# Patient Record
Sex: Female | Born: 1970 | Race: White | Hispanic: No | Marital: Married | Smoking: Never smoker
Health system: Southern US, Community
[De-identification: ages and names within clinical notes are randomized; demographics above are authoritative.]

## PROBLEM LIST (undated history)

## (undated) DIAGNOSIS — F410 Panic disorder [episodic paroxysmal anxiety] without agoraphobia: Secondary | ICD-10-CM

## (undated) DIAGNOSIS — E669 Obesity, unspecified: Secondary | ICD-10-CM

## (undated) DIAGNOSIS — R000 Tachycardia, unspecified: Secondary | ICD-10-CM

## (undated) DIAGNOSIS — F419 Anxiety disorder, unspecified: Secondary | ICD-10-CM

## (undated) DIAGNOSIS — G500 Trigeminal neuralgia: Secondary | ICD-10-CM

## (undated) DIAGNOSIS — Z87442 Personal history of urinary calculi: Secondary | ICD-10-CM

## (undated) DIAGNOSIS — K219 Gastro-esophageal reflux disease without esophagitis: Secondary | ICD-10-CM

## (undated) DIAGNOSIS — J189 Pneumonia, unspecified organism: Secondary | ICD-10-CM

## (undated) DIAGNOSIS — M1990 Unspecified osteoarthritis, unspecified site: Secondary | ICD-10-CM

## (undated) HISTORY — PX: BACK SURGERY: SHX140

## (undated) HISTORY — PX: ABDOMINAL HYSTERECTOMY: SHX81

## (undated) HISTORY — PX: KIDNEY STONE SURGERY: SHX686

## (undated) HISTORY — PX: KNEE ARTHROSCOPY: SUR90

## (undated) HISTORY — PX: EXPLORATORY LAPAROTOMY: SUR591

## (undated) HISTORY — PX: HERNIA REPAIR: SHX51

## (undated) HISTORY — PX: CHOLECYSTECTOMY: SHX55

---

## 2013-09-09 ENCOUNTER — Emergency Department (HOSPITAL_BASED_OUTPATIENT_CLINIC_OR_DEPARTMENT_OTHER)
Admission: EM | Admit: 2013-09-09 | Discharge: 2013-09-09 | Disposition: A | Discharge status: Home / Self Care | Payer: PRIVATE HEALTH INSURANCE | Attending: Emergency Medicine | Admitting: Emergency Medicine

## 2013-09-09 ENCOUNTER — Encounter (HOSPITAL_BASED_OUTPATIENT_CLINIC_OR_DEPARTMENT_OTHER): Payer: Self-pay | Admitting: Emergency Medicine

## 2013-09-09 ENCOUNTER — Emergency Department (HOSPITAL_BASED_OUTPATIENT_CLINIC_OR_DEPARTMENT_OTHER): Payer: PRIVATE HEALTH INSURANCE

## 2013-09-09 DIAGNOSIS — S93409A Sprain of unspecified ligament of unspecified ankle, initial encounter: Secondary | ICD-10-CM | POA: Insufficient documentation

## 2013-09-09 DIAGNOSIS — X500XXA Overexertion from strenuous movement or load, initial encounter: Secondary | ICD-10-CM | POA: Insufficient documentation

## 2013-09-09 DIAGNOSIS — Y929 Unspecified place or not applicable: Secondary | ICD-10-CM | POA: Insufficient documentation

## 2013-09-09 DIAGNOSIS — Y939 Activity, unspecified: Secondary | ICD-10-CM | POA: Insufficient documentation

## 2013-09-09 DIAGNOSIS — S93402A Sprain of unspecified ligament of left ankle, initial encounter: Secondary | ICD-10-CM

## 2013-09-09 HISTORY — DX: Trigeminal neuralgia: G50.0

## 2013-09-09 HISTORY — DX: Tachycardia, unspecified: R00.0

## 2013-09-09 NOTE — ED Notes (Signed)
Pt got toe caught in planter an fell twisting her ankle yest evening

## 2013-09-09 NOTE — ED Notes (Signed)
Pt ambulated to room. Refused w/c

## 2013-09-09 NOTE — ED Provider Notes (Signed)
CSN: [id number]     Arrival date & time 09/09/13  4098 History   First MD Initiated Contact with Patient 09/09/13 [phone]     Chief Complaint  Patient presents with  . Ankle Injury   (Consider location/radiation/quality/duration/timing/severity/associated sxs/prior Treatment) Patient is a 42 y.o. female presenting with lower extremity injury. The history is provided by the patient.  Ankle Injury   patient injured her left ankle and left fourth toe yesterday when she tripped over an object. Complains of sharp pain mostly at the medial distal tibia. Also notes sharp pain at the left fourth toe. Symptoms are worse with walking and no treatment used prior to arrival. Concerned she may have a fracture or sprain which is why she presents. No other complaints of  No past medical history on file. No past surgical history on file. No family history on file. History  Substance Use Topics  . Smoking status: Not on file  . Smokeless tobacco: Not on file  . Alcohol Use: Not on file   OB History   No data available     Review of Systems  All other systems reviewed and are negative.    Allergies  Review of patient's allergies indicates not on file.  Home Medications  No current outpatient prescriptions on file. BP 155/91  Pulse 77  Temp(Src) 98.1 F (36.7 C) (Oral)  Resp 18  SpO2 100% Physical Exam  Nursing note and vitals reviewed. Constitutional: She is oriented to person, place, and time. She appears well-developed and well-nourished.  Non-toxic appearance. No distress.  HENT:  Head: Normocephalic and atraumatic.  Eyes: Conjunctivae, EOM and lids are normal. Pupils are equal, round, and reactive to light.  Neck: Normal range of motion. Neck supple. No tracheal deviation present. No mass present.  Cardiovascular: Normal rate, regular rhythm and normal heart sounds.  Exam reveals no gallop.   No murmur heard. Pulmonary/Chest: Effort normal and breath sounds normal. No stridor. No  respiratory distress. She has no decreased breath sounds. She has no wheezes. She has no rhonchi. She has no rales.  Abdominal: Soft. Normal appearance and bowel sounds are normal. She exhibits no distension. There is no tenderness. There is no rebound and no CVA tenderness.  Musculoskeletal: Normal range of motion. She exhibits no edema and no tenderness.       Feet:  Neurological: She is alert and oriented to person, place, and time. She has normal strength. No cranial nerve deficit or sensory deficit. GCS eye subscore is 4. GCS verbal subscore is 5. GCS motor subscore is 6.  Skin: Skin is warm and dry. No abrasion and no rash noted.  Psychiatric: She has a normal mood and affect. Her speech is normal and behavior is normal.    ED Course  Procedures (including critical care time) Labs Review Labs Reviewed - No data to display Imaging Review No results found.  MDM  No diagnosis found. X-rays neg, will place in ASO splint, and pt instructed to f/u her orthopedist    Toy Baker, MD 09/09/13 [phone]

## 2013-09-09 NOTE — ED Notes (Signed)
Proper crutch education provided, patient shown return demonstration.

## 2014-12-06 ENCOUNTER — Emergency Department (HOSPITAL_BASED_OUTPATIENT_CLINIC_OR_DEPARTMENT_OTHER)
Admission: EM | Admit: 2014-12-06 | Discharge: 2014-12-06 | Disposition: A | Discharge status: Home / Self Care | Payer: PRIVATE HEALTH INSURANCE | Attending: Emergency Medicine | Admitting: Emergency Medicine

## 2014-12-06 ENCOUNTER — Emergency Department (HOSPITAL_BASED_OUTPATIENT_CLINIC_OR_DEPARTMENT_OTHER): Payer: PRIVATE HEALTH INSURANCE

## 2014-12-06 ENCOUNTER — Encounter (HOSPITAL_BASED_OUTPATIENT_CLINIC_OR_DEPARTMENT_OTHER): Payer: Self-pay | Admitting: *Deleted

## 2014-12-06 DIAGNOSIS — Z79899 Other long term (current) drug therapy: Secondary | ICD-10-CM | POA: Diagnosis not present

## 2014-12-06 DIAGNOSIS — Z3202 Encounter for pregnancy test, result negative: Secondary | ICD-10-CM | POA: Diagnosis not present

## 2014-12-06 DIAGNOSIS — R1031 Right lower quadrant pain: Secondary | ICD-10-CM | POA: Insufficient documentation

## 2014-12-06 DIAGNOSIS — R109 Unspecified abdominal pain: Secondary | ICD-10-CM

## 2014-12-06 DIAGNOSIS — Z9049 Acquired absence of other specified parts of digestive tract: Secondary | ICD-10-CM | POA: Diagnosis not present

## 2014-12-06 DIAGNOSIS — Z975 Presence of (intrauterine) contraceptive device: Secondary | ICD-10-CM | POA: Diagnosis not present

## 2014-12-06 DIAGNOSIS — R1011 Right upper quadrant pain: Secondary | ICD-10-CM | POA: Diagnosis not present

## 2014-12-06 LAB — CBC WITH DIFFERENTIAL/PLATELET
BASOS ABS: 0 10*3/uL (ref 0.0–0.1)
Basophils Relative: 1 % (ref 0–1)
Eosinophils Absolute: 0 10*3/uL (ref 0.0–0.7)
Eosinophils Relative: 1 % (ref 0–5)
HEMATOCRIT: 46.6 % — AB (ref 36.0–46.0)
Hemoglobin: 15.3 g/dL — ABNORMAL HIGH (ref 12.0–15.0)
Lymphocytes Relative: 37 % (ref 12–46)
Lymphs Abs: 1.5 10*3/uL (ref 0.7–4.0)
MCH: 31.9 pg (ref 26.0–34.0)
MCHC: 32.8 g/dL (ref 30.0–36.0)
MCV: 97.3 fL (ref 78.0–100.0)
Monocytes Absolute: 0.1 10*3/uL (ref 0.1–1.0)
Monocytes Relative: 3 % (ref 3–12)
NEUTROS ABS: 2.4 10*3/uL (ref 1.7–7.7)
Neutrophils Relative %: 58 % (ref 43–77)
PLATELETS: 248 10*3/uL (ref 150–400)
RBC: 4.79 MIL/uL (ref 3.87–5.11)
RDW: 12.2 % (ref 11.5–15.5)
WBC: 4.1 10*3/uL (ref 4.0–10.5)

## 2014-12-06 LAB — PREGNANCY, URINE: Preg Test, Ur: NEGATIVE

## 2014-12-06 LAB — URINALYSIS, ROUTINE W REFLEX MICROSCOPIC
BILIRUBIN URINE: NEGATIVE
Glucose, UA: NEGATIVE mg/dL
KETONES UR: NEGATIVE mg/dL
NITRITE: NEGATIVE
PH: 5.5 (ref 5.0–8.0)
Protein, ur: NEGATIVE mg/dL
Specific Gravity, Urine: 1.015 (ref 1.005–1.030)
Urobilinogen, UA: 0.2 mg/dL (ref 0.0–1.0)

## 2014-12-06 LAB — COMPREHENSIVE METABOLIC PANEL
ALBUMIN: 4.7 g/dL (ref 3.5–5.2)
ALT: 15 U/L (ref 0–35)
AST: 21 U/L (ref 0–37)
Alkaline Phosphatase: 67 U/L (ref 39–117)
Anion gap: 9 (ref 5–15)
BILIRUBIN TOTAL: 0.7 mg/dL (ref 0.3–1.2)
BUN: 9 mg/dL (ref 6–23)
CHLORIDE: 106 meq/L (ref 96–112)
CO2: 22 mmol/L (ref 19–32)
Calcium: 9.5 mg/dL (ref 8.4–10.5)
Creatinine, Ser: 0.79 mg/dL (ref 0.50–1.10)
GFR calc Af Amer: >90 mL/min (ref >90)
Glucose, Bld: 103 mg/dL — ABNORMAL HIGH (ref 70–99)
Potassium: 3.6 mmol/L (ref 3.5–5.1)
SODIUM: 137 mmol/L (ref 135–145)
Total Protein: 7.6 g/dL (ref 6.0–8.3)

## 2014-12-06 LAB — WET PREP, GENITAL: TRICH WET PREP: NONE SEEN

## 2014-12-06 LAB — URINE MICROSCOPIC-ADD ON

## 2014-12-06 LAB — LIPASE, BLOOD: Lipase: 37 U/L (ref 11–59)

## 2014-12-06 MED ORDER — ONDANSETRON HCL 4 MG/2ML IJ SOLN
4.0000 mg | Freq: Once | INTRAMUSCULAR | Status: AC
  Administered 2014-12-06: 4 mg via INTRAVENOUS
  Filled 2014-12-06: qty 2

## 2014-12-06 MED ORDER — ONDANSETRON HCL 4 MG/2ML IJ SOLN
4.0000 mg | Freq: Once | INTRAMUSCULAR | Status: AC
Start: 2014-12-06 — End: 2014-12-06
  Administered 2014-12-06: 4 mg via INTRAVENOUS
  Filled 2014-12-06: qty 2

## 2014-12-06 MED ORDER — HYDROCODONE-ACETAMINOPHEN 5-325 MG PO TABS: 1.0000 | ORAL_TABLET | Freq: Four times a day (QID) | ORAL | Status: DC | PRN

## 2014-12-06 MED ORDER — MORPHINE SULFATE 4 MG/ML IJ SOLN
4.0000 mg | Freq: Once | INTRAMUSCULAR | Status: AC
  Administered 2014-12-06: 4 mg via INTRAVENOUS
  Filled 2014-12-06: qty 1

## 2014-12-06 MED ORDER — IOHEXOL 300 MG/ML  SOLN: 25.0000 mL | Freq: Once | INTRAMUSCULAR | Status: DC | PRN

## 2014-12-06 MED ORDER — IOHEXOL 300 MG/ML  SOLN
100.0000 mL | Freq: Once | INTRAMUSCULAR | Status: AC | PRN
  Administered 2014-12-06: 100 mL via INTRAVENOUS

## 2014-12-06 MED ORDER — MORPHINE SULFATE 4 MG/ML IJ SOLN: 4.0000 mg | Freq: Once | INTRAMUSCULAR | Status: DC

## 2014-12-06 MED ORDER — HYDROMORPHONE HCL 1 MG/ML IJ SOLN
1.0000 mg | Freq: Once | INTRAMUSCULAR | Status: AC
  Administered 2014-12-06: 1 mg via INTRAVENOUS
  Filled 2014-12-06: qty 1

## 2014-12-06 NOTE — ED Provider Notes (Signed)
CSN: [id number]     Arrival date & time 12/06/14  1301 History   First MD Initiated Contact with Patient 12/06/14 1413     Chief Complaint  Patient presents with  . Flank Pain     Patient is a 43 y.o. female presenting with abdominal pain. The history is provided by the patient.  Abdominal Pain Pain location:  RUQ and RLQ Pain quality: sharp   Pain severity:  Moderate Onset quality:  Gradual Duration:  12 hours Timing:  Constant Progression:  Worsening Chronicity:  New Relieved by:  Nothing Worsened by:  Palpation Associated symptoms: nausea   Associated symptoms: no chest pain, no dysuria, no fever, no vaginal bleeding, no vaginal discharge and no vomiting   Patient presents for sharp abd pain for past 12 hours She has never had this before She had otherwise been well prior to the start of the pain   She is sexually active with husband only She has IUD in place   Past Medical History  Diagnosis Date  . Tachycardia   . Trigeminal neuralgia    Past Surgical History  Procedure Laterality Date  . Hernia repair    . Cholecystectomy    . Kidney stone surgery    . Exploratory laparotomy     History reviewed. No pertinent family history. History  Substance Use Topics  . Smoking status: Never Smoker   . Smokeless tobacco: Not on file  . Alcohol Use: Yes   OB History    No data available     Review of Systems  Constitutional: Negative for fever.  Cardiovascular: Negative for chest pain.  Gastrointestinal: Positive for nausea and abdominal pain. Negative for vomiting.  Genitourinary: Negative for dysuria, vaginal bleeding, vaginal discharge and dyspareunia.  All other systems reviewed and are negative.     Allergies  Codeine  Home Medications   Prior to Admission medications   Medication Sig Start Date End Date Taking? Authorizing Provider  metoprolol tartrate (LOPRESSOR) 25 MG tablet Take 25 mg by mouth 2 (two) times daily.   Yes Historical Provider, MD    BP 122/91 mmHg  Pulse 101  Temp(Src) 98 F (36.7 C)  Resp 16  Ht 5\' 4"  (1.626 m)  Wt 160 lb (72.576 kg)  BMI 27.45 kg/m2  SpO2 98% Physical Exam CONSTITUTIONAL: Well developed/well nourished HEAD: Normocephalic/atraumatic EYES: EOMI/PERRL ENMT: Mucous membranes moist NECK: supple no meningeal signs SPINE/BACK:entire spine nontender CV: S1/S2 noted, no murmurs/rubs/gallops noted LUNGS: Lungs are clear to auscultation bilaterally, no apparent distress ABDOMEN: soft, moderate RLQ tenderness, no rebound or guarding, bowel sounds noted throughout abdomen GU:no cva tenderness. No cmt.  Mild left adnexal tenderness.  No right adnexal tenderness.  Small amt of yellowish discharge.  No vaginal bleeding.  Female chaperone present NEURO: Pt is awake/alert/appropriate, moves all extremitiesx4.  No facial droop.   EXTREMITIES: pulses normal/equal, full ROM SKIN: warm, color normal PSYCH: no abnormalities of mood noted, alert and oriented to situation  ED Course  Procedures    3:51 PM Pt with continued RLQ tenderness Will proceed with CT imaging D/w dr Stark Jock, plan is to f/u on CT scan If negative would be candidate for d/c home  Labs Review Labs Reviewed  URINALYSIS, ROUTINE W REFLEX MICROSCOPIC - Abnormal; Notable for the following:    APPearance CLOUDY (*)    Hgb urine dipstick TRACE (*)    Leukocytes, UA TRACE (*)    All other components within normal limits  URINE MICROSCOPIC-ADD ON - Abnormal;  Notable for the following:    Squamous Epithelial / LPF MANY (*)    Bacteria, UA MANY (*)    All other components within normal limits  CBC WITH DIFFERENTIAL - Abnormal; Notable for the following:    Hemoglobin 15.3 (*)    HCT 46.6 (*)    All other components within normal limits  COMPREHENSIVE METABOLIC PANEL - Abnormal; Notable for the following:    Glucose, Bld 103 (*)    All other components within normal limits  GC/CHLAMYDIA PROBE AMP  WET PREP, GENITAL  PREGNANCY, URINE   LIPASE, BLOOD      Medications  iohexol (OMNIPAQUE) 300 MG/ML solution 25 mL (not administered)  iohexol (OMNIPAQUE) 300 MG/ML solution 100 mL (not administered)  morphine 4 MG/ML injection 4 mg (4 mg Intravenous Given 12/06/14 1439)  ondansetron (ZOFRAN) injection 4 mg (4 mg Intravenous Given 12/06/14 1439)  HYDROmorphone (DILAUDID) injection 1 mg (1 mg Intravenous Given 12/06/14 1530)  ondansetron (ZOFRAN) injection 4 mg (4 mg Intravenous Given 12/06/14 1530)    MDM   Final diagnoses:  None    Nursing notes including past medical history and social history reviewed and considered in documentation Labs/vital reviewed myself and considered during evaluation     Sharyon Cable, MD 12/06/14 1552

## 2014-12-06 NOTE — ED Notes (Signed)
MD at bedside. 

## 2014-12-06 NOTE — Discharge Instructions (Signed)
Hydrocodone as prescribed as needed for pain.  Ultrasound of the pelvis to be performed as an outpatient as scheduled.  Return to the emergency department if you develop high fever, worsening pain, bloody stool, or other new and concerning symptoms.   Abdominal Pain, Women Abdominal (stomach, pelvic, or belly) pain can be caused by many things. It is important to tell your doctor:  The location of the pain.  Does it come and go or is it present all the time?  Are there things that start the pain (eating certain foods, exercise)?  Are there other symptoms associated with the pain (fever, nausea, vomiting, diarrhea)? All of this is helpful to know when trying to find the cause of the pain. CAUSES   Stomach: virus or bacteria infection, or ulcer.  Intestine: appendicitis (inflamed appendix), regional ileitis (Crohn's disease), ulcerative colitis (inflamed colon), irritable bowel syndrome, diverticulitis (inflamed diverticulum of the colon), or cancer of the stomach or intestine.  Gallbladder disease or stones in the gallbladder.  Kidney disease, kidney stones, or infection.  Pancreas infection or cancer.  Fibromyalgia (pain disorder).  Diseases of the female organs:  Uterus: fibroid (non-cancerous) tumors or infection.  Fallopian tubes: infection or tubal pregnancy.  Ovary: cysts or tumors.  Pelvic adhesions (scar tissue).  Endometriosis (uterus lining tissue growing in the pelvis and on the pelvic organs).  Pelvic congestion syndrome (female organs filling up with blood just before the menstrual period).  Pain with the menstrual period.  Pain with ovulation (producing an egg).  Pain with an IUD (intrauterine device, birth control) in the uterus.  Cancer of the female organs.  Functional pain (pain not caused by a disease, may improve without treatment).  Psychological pain.  Depression. DIAGNOSIS  Your doctor will decide the seriousness of your pain by doing  an examination.  Blood tests.  X-rays.  Ultrasound.  CT scan (computed tomography, special type of X-ray).  MRI (magnetic resonance imaging).  Cultures, for infection.  Barium enema (dye inserted in the large intestine, to better view it with X-rays).  Colonoscopy (looking in intestine with a lighted tube).  Laparoscopy (minor surgery, looking in abdomen with a lighted tube).  Major abdominal exploratory surgery (looking in abdomen with a large incision). TREATMENT  The treatment will depend on the cause of the pain.   Many cases can be observed and treated at home.  Over-the-counter medicines recommended by your caregiver.  Prescription medicine.  Antibiotics, for infection.  Birth control pills, for painful periods or for ovulation pain.  Hormone treatment, for endometriosis.  Nerve blocking injections.  Physical therapy.  Antidepressants.  Counseling with a psychologist or psychiatrist.  Minor or major surgery. HOME CARE INSTRUCTIONS   Do not take laxatives, unless directed by your caregiver.  Take over-the-counter pain medicine only if ordered by your caregiver. Do not take aspirin because it can cause an upset stomach or bleeding.  Try a clear liquid diet (broth or water) as ordered by your caregiver. Slowly move to a bland diet, as tolerated, if the pain is related to the stomach or intestine.  Have a thermometer and take your temperature several times a day, and record it.  Bed rest and sleep, if it helps the pain.  Avoid sexual intercourse, if it causes pain.  Avoid stressful situations.  Keep your follow-up appointments and tests, as your caregiver orders.  If the pain does not go away with medicine or surgery, you may try:  Acupuncture.  Relaxation exercises (yoga, meditation).  Group  therapy.  Counseling. SEEK MEDICAL CARE IF:   You notice certain foods cause stomach pain.  Your home care treatment is not helping your pain.  You  need stronger pain medicine.  You want your IUD removed.  You feel faint or lightheaded.  You develop nausea and vomiting.  You develop a rash.  You are having side effects or an allergy to your medicine. SEEK IMMEDIATE MEDICAL CARE IF:   Your pain does not go away or gets worse.  You have a fever.  Your pain is felt only in portions of the abdomen. The right side could possibly be appendicitis. The left lower portion of the abdomen could be colitis or diverticulitis.  You are passing blood in your stools (bright red or black tarry stools, with or without vomiting).  You have blood in your urine.  You develop chills, with or without a fever.  You pass out. MAKE SURE YOU:   Understand these instructions.  Will watch your condition.  Will get help right away if you are not doing well or get worse. Document Released: 09/25/2007 Document Revised: 04/14/2014 Document Reviewed: 10/15/2009 [facility] Patient Information 2015 [place], [place]. This information is not intended to replace advice given to you by your health care provider. Make sure you discuss any questions you have with your health care provider.

## 2014-12-06 NOTE — ED Provider Notes (Addendum)
Care assumed from Dr. Christy Gentles at shift change. Patient presents here with a 12 hour history of right lower quadrant pain. Her workup reveals no fever, white count, and CT scan does not reveal any acute intra-abdominal abnormality. I highly doubt appendicitis, ovarian torsion, ectopic pregnancy or any other emergent pathology. I believe she is appropriate for discharge. As there is no ultrasound tech on duty today, I will arrange an outpatient ultrasound which can be performed in the next 1-2 days if her pain is not improving.  Also of note is that this patient has had 13 prescriptions for controlled substances since the beginning of August.  Veryl Speak, MD 12/06/14 [place], MD 12/06/14 [phone]

## 2014-12-06 NOTE — ED Notes (Signed)
Pt c/o right flank pain x 2 hrs

## 2014-12-06 NOTE — ED Notes (Signed)
Patient transported to CT 

## 2014-12-07 ENCOUNTER — Other Ambulatory Visit (HOSPITAL_BASED_OUTPATIENT_CLINIC_OR_DEPARTMENT_OTHER): Payer: Self-pay | Admitting: Emergency Medicine

## 2014-12-07 DIAGNOSIS — R1031 Right lower quadrant pain: Secondary | ICD-10-CM

## 2014-12-08 ENCOUNTER — Ambulatory Visit (HOSPITAL_BASED_OUTPATIENT_CLINIC_OR_DEPARTMENT_OTHER)
Admission: RE | Admit: 2014-12-08 | Discharge: 2014-12-08 | Disposition: A | Discharge status: Home / Self Care | Payer: PRIVATE HEALTH INSURANCE | Source: Ambulatory Visit | Attending: Emergency Medicine | Admitting: Emergency Medicine

## 2014-12-08 DIAGNOSIS — R1031 Right lower quadrant pain: Secondary | ICD-10-CM | POA: Diagnosis present

## 2014-12-08 DIAGNOSIS — N8320 Unspecified ovarian cysts: Secondary | ICD-10-CM | POA: Insufficient documentation

## 2014-12-08 DIAGNOSIS — D251 Intramural leiomyoma of uterus: Secondary | ICD-10-CM | POA: Insufficient documentation

## 2014-12-08 LAB — GC/CHLAMYDIA PROBE AMP
CT Probe RNA: NEGATIVE
GC Probe RNA: NEGATIVE

## 2018-08-08 ENCOUNTER — Emergency Department (HOSPITAL_BASED_OUTPATIENT_CLINIC_OR_DEPARTMENT_OTHER): Payer: BLUE CROSS/BLUE SHIELD

## 2018-08-08 ENCOUNTER — Encounter (HOSPITAL_BASED_OUTPATIENT_CLINIC_OR_DEPARTMENT_OTHER): Payer: Self-pay | Admitting: Emergency Medicine

## 2018-08-08 ENCOUNTER — Other Ambulatory Visit: Payer: Self-pay

## 2018-08-08 ENCOUNTER — Emergency Department (HOSPITAL_BASED_OUTPATIENT_CLINIC_OR_DEPARTMENT_OTHER)
Admission: EM | Admit: 2018-08-08 | Discharge: 2018-08-08 | Disposition: A | Discharge status: Home / Self Care | Payer: BLUE CROSS/BLUE SHIELD | Attending: Emergency Medicine | Admitting: Emergency Medicine

## 2018-08-08 DIAGNOSIS — S63614A Unspecified sprain of right ring finger, initial encounter: Secondary | ICD-10-CM | POA: Diagnosis not present

## 2018-08-08 DIAGNOSIS — S01512A Laceration without foreign body of oral cavity, initial encounter: Secondary | ICD-10-CM | POA: Insufficient documentation

## 2018-08-08 DIAGNOSIS — S80219A Abrasion, unspecified knee, initial encounter: Secondary | ICD-10-CM

## 2018-08-08 DIAGNOSIS — Z79899 Other long term (current) drug therapy: Secondary | ICD-10-CM | POA: Insufficient documentation

## 2018-08-08 DIAGNOSIS — Y92410 Unspecified street and highway as the place of occurrence of the external cause: Secondary | ICD-10-CM | POA: Insufficient documentation

## 2018-08-08 DIAGNOSIS — Y999 Unspecified external cause status: Secondary | ICD-10-CM | POA: Insufficient documentation

## 2018-08-08 DIAGNOSIS — S0121XA Laceration without foreign body of nose, initial encounter: Secondary | ICD-10-CM | POA: Diagnosis not present

## 2018-08-08 DIAGNOSIS — M25512 Pain in left shoulder: Secondary | ICD-10-CM

## 2018-08-08 DIAGNOSIS — Y9389 Activity, other specified: Secondary | ICD-10-CM | POA: Insufficient documentation

## 2018-08-08 DIAGNOSIS — S80212A Abrasion, left knee, initial encounter: Secondary | ICD-10-CM | POA: Insufficient documentation

## 2018-08-08 DIAGNOSIS — S63694A Other sprain of right ring finger, initial encounter: Secondary | ICD-10-CM

## 2018-08-08 DIAGNOSIS — S80211A Abrasion, right knee, initial encounter: Secondary | ICD-10-CM | POA: Insufficient documentation

## 2018-08-08 DIAGNOSIS — S01511A Laceration without foreign body of lip, initial encounter: Secondary | ICD-10-CM

## 2018-08-08 DIAGNOSIS — S00502A Unspecified superficial injury of oral cavity, initial encounter: Secondary | ICD-10-CM | POA: Diagnosis present

## 2018-08-08 HISTORY — DX: Panic disorder (episodic paroxysmal anxiety): F41.0

## 2018-08-08 HISTORY — DX: Gastro-esophageal reflux disease without esophagitis: K21.9

## 2018-08-08 HISTORY — DX: Anxiety disorder, unspecified: F41.9

## 2018-08-08 HISTORY — DX: Obesity, unspecified: E66.9

## 2018-08-08 MED ORDER — ACETAMINOPHEN 500 MG PO TABS
1000.0000 mg | ORAL_TABLET | Freq: Once | ORAL | Status: AC
  Administered 2018-08-08: 1000 mg via ORAL
  Filled 2018-08-08: qty 2

## 2018-08-08 MED ORDER — IBUPROFEN 800 MG PO TABS
800.0000 mg | ORAL_TABLET | Freq: Once | ORAL | Status: AC
  Administered 2018-08-08: 800 mg via ORAL
  Filled 2018-08-08: qty 1

## 2018-08-08 NOTE — Discharge Instructions (Addendum)
You will hurt worse tomorrow.  Tylenol and ibuprofen for pain Follow up with your PCP.

## 2018-08-08 NOTE — ED Notes (Signed)
ED Provider at bedside discussing test results and dispo plan of care. 
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
## 2018-08-08 NOTE — ED Notes (Signed)
ED Provider at bedside. 
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
## 2020-12-01 ENCOUNTER — Emergency Department (HOSPITAL_BASED_OUTPATIENT_CLINIC_OR_DEPARTMENT_OTHER)
Admission: EM | Admit: 2020-12-01 | Discharge: 2020-12-01 | Disposition: A | Discharge status: Home / Self Care | Payer: Commercial Managed Care - PPO | Attending: Emergency Medicine | Admitting: Emergency Medicine

## 2020-12-01 ENCOUNTER — Encounter (HOSPITAL_BASED_OUTPATIENT_CLINIC_OR_DEPARTMENT_OTHER): Payer: Self-pay | Admitting: Emergency Medicine

## 2020-12-01 ENCOUNTER — Other Ambulatory Visit: Payer: Self-pay

## 2020-12-01 DIAGNOSIS — R309 Painful micturition, unspecified: Secondary | ICD-10-CM | POA: Insufficient documentation

## 2020-12-01 DIAGNOSIS — N3000 Acute cystitis without hematuria: Secondary | ICD-10-CM

## 2020-12-01 DIAGNOSIS — R531 Weakness: Secondary | ICD-10-CM | POA: Diagnosis not present

## 2020-12-01 DIAGNOSIS — R319 Hematuria, unspecified: Secondary | ICD-10-CM | POA: Diagnosis not present

## 2020-12-01 DIAGNOSIS — Z20822 Contact with and (suspected) exposure to covid-19: Secondary | ICD-10-CM | POA: Diagnosis not present

## 2020-12-01 LAB — CBC WITH DIFFERENTIAL/PLATELET
Abs Immature Granulocytes: 0.03 10*3/uL (ref 0.00–0.07)
Basophils Absolute: 0 10*3/uL (ref 0.0–0.1)
Basophils Relative: 1 %
Eosinophils Absolute: 0 10*3/uL (ref 0.0–0.5)
Eosinophils Relative: 1 %
HCT: 43 % (ref 36.0–46.0)
Hemoglobin: 14.3 g/dL (ref 12.0–15.0)
Immature Granulocytes: 1 %
Lymphocytes Relative: 20 %
Lymphs Abs: 1 10*3/uL (ref 0.7–4.0)
MCH: 30.9 pg (ref 26.0–34.0)
MCHC: 33.3 g/dL (ref 30.0–36.0)
MCV: 92.9 fL (ref 80.0–100.0)
Monocytes Absolute: 0.4 10*3/uL (ref 0.1–1.0)
Monocytes Relative: 8 %
Neutro Abs: 3.4 10*3/uL (ref 1.7–7.7)
Neutrophils Relative %: 69 %
Platelets: 204 10*3/uL (ref 150–400)
RBC: 4.63 MIL/uL (ref 3.87–5.11)
RDW: 13.4 % (ref 11.5–15.5)
WBC: 4.8 10*3/uL (ref 4.0–10.5)
nRBC: 0 % (ref 0.0–0.2)

## 2020-12-01 LAB — URINALYSIS, ROUTINE W REFLEX MICROSCOPIC
Glucose, UA: 100 mg/dL — AB
Ketones, ur: 15 mg/dL — AB
Leukocytes,Ua: NEGATIVE
Nitrite: NEGATIVE
Protein, ur: NEGATIVE mg/dL
Specific Gravity, Urine: >1.03 — ABNORMAL HIGH (ref 1.005–1.030)
pH: 5.5 (ref 5.0–8.0)

## 2020-12-01 LAB — BASIC METABOLIC PANEL
Anion gap: 15 (ref 5–15)
BUN: 11 mg/dL (ref 6–20)
CO2: 18 mmol/L — ABNORMAL LOW (ref 22–32)
Calcium: 9.7 mg/dL (ref 8.9–10.3)
Chloride: 106 mmol/L (ref 98–111)
Creatinine, Ser: 1.04 mg/dL — ABNORMAL HIGH (ref 0.44–1.00)
GFR, Estimated: >60 mL/min (ref >60)
Glucose, Bld: 97 mg/dL (ref 70–99)
Potassium: 3.4 mmol/L — ABNORMAL LOW (ref 3.5–5.1)
Sodium: 139 mmol/L (ref 135–145)

## 2020-12-01 LAB — URINALYSIS, MICROSCOPIC (REFLEX)

## 2020-12-01 LAB — RESP PANEL BY RT-PCR (FLU A&B, COVID) ARPGX2
Influenza A by PCR: NEGATIVE
Influenza B by PCR: NEGATIVE
SARS Coronavirus 2 by RT PCR: NEGATIVE

## 2020-12-01 MED ORDER — SODIUM CHLORIDE 0.9 % IV BOLUS
1000.0000 mL | Freq: Once | INTRAVENOUS | Status: AC
  Administered 2020-12-01: 1000 mL via INTRAVENOUS

## 2020-12-01 MED ORDER — ONDANSETRON HCL 4 MG/2ML IJ SOLN
4.0000 mg | Freq: Once | INTRAMUSCULAR | Status: AC
  Administered 2020-12-01: 4 mg via INTRAVENOUS

## 2020-12-01 MED ORDER — ONDANSETRON 4 MG PO TBDP: 4.0000 mg | ORAL_TABLET | Freq: Three times a day (TID) | ORAL | 0 refills | Status: DC | PRN

## 2020-12-01 MED ORDER — PHENAZOPYRIDINE HCL 200 MG PO TABS: 200.0000 mg | ORAL_TABLET | Freq: Three times a day (TID) | ORAL | 0 refills | Status: DC | PRN

## 2020-12-01 NOTE — Discharge Instructions (Signed)
You were seen in the emergency department today with urinary tract infection symptoms.  Your urine today showed many bacteria but the sample is somewhat compromised by your antibiotic use at home.  Please continue the antibiotic which you have been taking, Cipro.  Follow closely with your primary care doctor.  We are sending her urine for culture and if bacteria grow which need a different antibiotic you will be called. Return to the ED with any new or suddenly worsening symptoms.

## 2020-12-01 NOTE — ED Triage Notes (Signed)
Pt reports recent UTI and was placed on Cipro, but missed doses from Thurs to [place]; pt reports urinary frequency, bilateral lower back pain, and N/V/D; pt denies hematuria

## 2020-12-01 NOTE — ED Provider Notes (Signed)
[facility] EMERGENCY DEPARTMENT Provider Note   CSN: [id number] Arrival date & time: 12/01/20  0543     History Chief Complaint  Patient presents with  . Urinary Tract Infection    Bianca Gibson is a 49 y.o. female.  HPI     This is a 49 year old female with a history of reflux, anxiety, atrial tachycardia who presents with generalized weakness urinary symptoms.  Patient reports that she recently had a UTI during the last week of November.  She took a full course of Cipro for that.  She was feeling well and had surgery on December 1.  She states that last week she began to have recurrent symptoms including hematuria and pressure with urination.  She started taking Cipro again because she had a refill.  She took Cipro on Tuesday and Wednesday.  She went out of town on Thursday and forgot her medications.  She states she generally felt poorly through the weekend with generalized weakness and nausea.  She states she did not feel like she could eat much.  She restarted Cipro on Sunday but her symptoms have not improved.  She denies any dysuria or hematuria but states she still feels bloated over the lower abdomen and uncomfortable.  She reports chills without documented fevers.  She states in general she has not felt well over the last several months.  She did test herself for Covid at home and that was negative.  She is fully vaccinated.  No known sick contacts or exposures  Past Medical History:  Diagnosis Date  . Anxiety   . GERD (gastroesophageal reflux disease)   . Obesity   . Panic attack   . Tachycardia   . Trigeminal neuralgia     There are no problems to display for this patient.   Past Surgical History:  Procedure Laterality Date  . ABDOMINAL HYSTERECTOMY    . CHOLECYSTECTOMY    . EXPLORATORY LAPAROTOMY    . HERNIA REPAIR    . KIDNEY STONE SURGERY    . KNEE ARTHROSCOPY Left      OB History   No obstetric history on file.     No family history on  file.  Social History   Tobacco Use  . Smoking status: Never Smoker  . Smokeless tobacco: Never Used  Substance Use Topics  . Alcohol use: Yes    Comment: 1 glass wine/day  . Drug use: No    Home Medications Prior to Admission medications   Medication Sig Start Date End Date Taking? Authorizing Provider  ALPRAZolam Duanne Moron) 0.5 MG tablet Take 0.5 mg by mouth 3 (three) times daily as needed for anxiety.    [provider]  esomeprazole (NEXIUM) 40 MG capsule Take 40 mg by mouth at bedtime.    [provider]  lamoTRIgine (LAMICTAL) 150 MG tablet Take 150 mg by mouth daily.    [provider]  metoprolol tartrate (LOPRESSOR) 25 MG tablet Take 100 mg by mouth 2 (two) times daily.     [provider]    Allergies    Codeine  Review of Systems   Review of Systems  Constitutional: Negative for fever.  Respiratory: Negative for shortness of breath.   Cardiovascular: Negative for chest pain.  Gastrointestinal: Positive for abdominal pain and nausea. Negative for diarrhea and vomiting.  Genitourinary: Positive for hematuria and urgency. Negative for dysuria.  All other systems reviewed and are negative.   Physical Exam Updated Vital Signs BP 117/66  Pulse 74   Temp 98.7 F (37.1 C) (Oral)   Resp 17   Ht 1.6 m (5\' 3" )   Wt 82.6 kg   SpO2 99%   BMI 32.24 kg/m   Physical Exam Vitals and nursing note reviewed.  Constitutional:      Appearance: She is well-developed and well-nourished. She is obese. She is not ill-appearing.  HENT:     Head: Normocephalic and atraumatic.     Nose: Nose normal.     Mouth/Throat:     Mouth: Mucous membranes are dry.  Eyes:     Pupils: Pupils are equal, round, and reactive to light.  Cardiovascular:     Rate and Rhythm: Normal rate and regular rhythm.     Heart sounds: Normal heart sounds.  Pulmonary:     Effort: Pulmonary effort is normal. No respiratory distress.     Breath sounds: No wheezing.   Abdominal:     General: Bowel sounds are normal.     Palpations: Abdomen is soft.     Tenderness: There is no abdominal tenderness. There is no right CVA tenderness, left CVA tenderness, guarding or rebound.  Musculoskeletal:     Cervical back: Neck supple.     Right lower leg: No edema.     Left lower leg: No edema.  Skin:    General: Skin is warm and dry.  Neurological:     Mental Status: She is alert and oriented to person, place, and time.  Psychiatric:        Mood and Affect: Mood and affect and mood normal.     ED Results / Procedures / Treatments   Labs (all labs ordered are listed, but only abnormal results are displayed) Labs Reviewed  URINALYSIS, ROUTINE W REFLEX MICROSCOPIC - Abnormal; Notable for the following components:      Result Value   APPearance HAZY (*)    Specific Gravity, Urine >1.030 (*)    Glucose, UA 100 (*)    Hgb urine dipstick TRACE (*)    Bilirubin Urine SMALL (*)    Ketones, ur 15 (*)    All other components within normal limits  BASIC METABOLIC PANEL - Abnormal; Notable for the following components:   Potassium 3.4 (*)    CO2 18 (*)    Creatinine, Ser 1.04 (*)    All other components within normal limits  URINALYSIS, MICROSCOPIC (REFLEX) - Abnormal; Notable for the following components:   Bacteria, UA MANY (*)    All other components within normal limits  URINE CULTURE  RESP PANEL BY RT-PCR (FLU A&B, COVID) [id number]  CBC WITH DIFFERENTIAL/PLATELET    EKG None  Radiology No results found.  Procedures Procedures (including critical care time)  Medications Ordered in ED Medications  sodium chloride 0.9 % bolus 1,000 mL (1,000 mLs Intravenous New Bag/Given 12/01/20 0622)  ondansetron (ZOFRAN) injection 4 mg (4 mg Intravenous Given 12/01/20 [id number])    ED Course  I have reviewed the triage vital signs and the nursing notes.  Pertinent labs & imaging results that were available during my care of the patient were reviewed by me and  considered in my medical decision making (see chart for details).    MDM Rules/Calculators/A&P                          Patient presents today with urinary symptoms.  Has been on and off antibiotics and self diagnosed her urinary tract infection earlier this week.  She is also complaining of some systemic fatigue and dehydration.  No fevers but has had chills at home.  She is overall nontoxic vital signs are reassuring.  She is afebrile here.  Physical exam is largely benign.  Patient was given fluids given that she had some dry mucous membranes.  Lab work notable for slightly elevated creatinine and ketones in the urine which is likely reflective of some mild dehydration.  Urinalysis is difficult to interpret.  She does have some bacteria but only 0-5 white cells.  Urine culture was sent.  Urine culture from December 10 was reviewed from [facility] with less than 10,000 colonies.  I shared this with the patient.  Flu and Covid testing are pending.  Given symptoms, would stay the course with Cipro until culture data is available.  Continue pelvic floor PT for her ongoing lower abdominal pressure.  Patient signed out to oncoming provider.  Final Clinical Impression(s) / ED Diagnoses Final diagnoses:  None    Rx / DC Orders ED Discharge Orders    None       Aniella Wandrey, Barbette Hair, MD 12/01/20 [phone]

## 2020-12-01 NOTE — ED Provider Notes (Signed)
Blood pressure 117/66, pulse 74, temperature 98.7 F (37.1 C), temperature source Oral, resp. rate 17, height 5\' 3"  (1.6 m), weight 82.6 kg, SpO2 99 %.  Assuming care from Dr. Dina Rich.  In short, Bianca Gibson is a 49 y.o. female with a chief complaint of Urinary Tract Infection .  Refer to the original H&P for additional details.  The current plan of care is to f/u on respiratory panel and reassess. If negative, plan for patient to continue home Cipro until urine cultures direct otherwise.  COVID and Flu negative. Plan for patient to continue Cipro dosing and will send urine culture. Patient will be called if abx change is needed.     Margette Fast, MD 12/01/20 0730

## 2020-12-02 LAB — URINE CULTURE: Culture: <10000 — AB

## 2021-06-12 ENCOUNTER — Encounter (HOSPITAL_BASED_OUTPATIENT_CLINIC_OR_DEPARTMENT_OTHER): Payer: Self-pay | Admitting: Emergency Medicine

## 2021-06-12 ENCOUNTER — Emergency Department (HOSPITAL_BASED_OUTPATIENT_CLINIC_OR_DEPARTMENT_OTHER): Payer: Worker's Compensation

## 2021-06-12 ENCOUNTER — Other Ambulatory Visit: Payer: Self-pay

## 2021-06-12 ENCOUNTER — Emergency Department (HOSPITAL_BASED_OUTPATIENT_CLINIC_OR_DEPARTMENT_OTHER)
Admission: EM | Admit: 2021-06-12 | Discharge: 2021-06-12 | Disposition: A | Discharge status: Home / Self Care | Payer: Worker's Compensation | Attending: Emergency Medicine | Admitting: Emergency Medicine

## 2021-06-12 DIAGNOSIS — R55 Syncope and collapse: Secondary | ICD-10-CM | POA: Insufficient documentation

## 2021-06-12 DIAGNOSIS — R42 Dizziness and giddiness: Secondary | ICD-10-CM | POA: Insufficient documentation

## 2021-06-12 DIAGNOSIS — S3992XA Unspecified injury of lower back, initial encounter: Secondary | ICD-10-CM | POA: Diagnosis present

## 2021-06-12 DIAGNOSIS — Y990 Civilian activity done for income or pay: Secondary | ICD-10-CM | POA: Insufficient documentation

## 2021-06-12 DIAGNOSIS — R61 Generalized hyperhidrosis: Secondary | ICD-10-CM | POA: Insufficient documentation

## 2021-06-12 DIAGNOSIS — S32010A Wedge compression fracture of first lumbar vertebra, initial encounter for closed fracture: Secondary | ICD-10-CM | POA: Diagnosis not present

## 2021-06-12 DIAGNOSIS — W19XXXA Unspecified fall, initial encounter: Secondary | ICD-10-CM

## 2021-06-12 DIAGNOSIS — W010XXA Fall on same level from slipping, tripping and stumbling without subsequent striking against object, initial encounter: Secondary | ICD-10-CM | POA: Diagnosis not present

## 2021-06-12 DIAGNOSIS — Z79899 Other long term (current) drug therapy: Secondary | ICD-10-CM | POA: Diagnosis not present

## 2021-06-12 MED ORDER — TIZANIDINE HCL 4 MG PO TABS: 4.0000 mg | ORAL_TABLET | Freq: Four times a day (QID) | ORAL | 0 refills | Status: DC | PRN

## 2021-06-12 MED ORDER — SENNOSIDES-DOCUSATE SODIUM 8.6-50 MG PO TABS: 1.0000 | ORAL_TABLET | Freq: Every evening | ORAL | 0 refills | Status: DC | PRN

## 2021-06-12 MED ORDER — KETOROLAC TROMETHAMINE 60 MG/2ML IM SOLN
60.0000 mg | Freq: Once | INTRAMUSCULAR | Status: AC
  Administered 2021-06-12: 60 mg via INTRAMUSCULAR
  Filled 2021-06-12: qty 2

## 2021-06-12 MED ORDER — TIZANIDINE HCL 4 MG PO TABS: 4.0000 mg | ORAL_TABLET | Freq: Once | ORAL | Status: DC

## 2021-06-12 MED ORDER — IBUPROFEN 800 MG PO TABS: 800.0000 mg | ORAL_TABLET | Freq: Three times a day (TID) | ORAL | 0 refills | Status: DC | PRN

## 2021-06-12 MED ORDER — CYCLOBENZAPRINE HCL 10 MG PO TABS
10.0000 mg | ORAL_TABLET | Freq: Once | ORAL | Status: AC
  Administered 2021-06-12: 10 mg via ORAL
  Filled 2021-06-12: qty 1

## 2021-06-12 MED ORDER — OXYCODONE-ACETAMINOPHEN 5-325 MG PO TABS
1.0000 | ORAL_TABLET | Freq: Once | ORAL | Status: AC
  Administered 2021-06-12: 1 via ORAL
  Filled 2021-06-12: qty 1

## 2021-06-12 MED ORDER — OXYCODONE-ACETAMINOPHEN 5-325 MG PO TABS: 1.0000 | ORAL_TABLET | Freq: Four times a day (QID) | ORAL | 0 refills | Status: DC | PRN

## 2021-06-12 NOTE — Discharge Instructions (Addendum)
You were seen in the emergency department today after a fall.  You have a fracture in your back and we have placed you in a brace for comfort.  You may remove this for sleeping or bathing.  Please follow with the spine specialist listed above.  Please call the office on Tuesday to schedule a follow-up appointment.  I have called you in several medications for pain.  Percocet and Zanaflex should not be taken at the same time.  Both can cause drowsiness and affect your ability to drive a vehicle.  Percocet may also cause constipation and I have called in constipation medicine to the pharmacy as well.

## 2021-06-12 NOTE — ED Triage Notes (Signed)
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 Pt c/o lower back pain. Pt endorses ability after fall to get up, took a few steps the had syncope episode after sitting in a chair. Pt denies hitting head. Was eval. on scene by EMS

## 2021-06-12 NOTE — ED Notes (Signed)
Ortho Tech at bedside to apply TLSO brace

## 2021-06-12 NOTE — ED Notes (Signed)
Pt ambulated to lobby with steady gait- ED staff with pt

## 2021-06-12 NOTE — ED Provider Notes (Signed)
Blood pressure 118/73, pulse 66, temperature 98 F (36.7 C), temperature source Oral, resp. rate 17, height 5\' 3"  (1.6 m), weight 72.6 kg, SpO2 98 %.  Assuming care from Dr. Maryan Rued.  In short, Bianca Gibson is a 50 y.o. female with a chief complaint of Fall .  Refer to the original H&P for additional details.  The current plan of care is to follow up after brace fitting.  03:48 PM  Patient is being fitted with the TLSO brace for her L1 compression fracture.  I reviewed the [place] drug database and prescribed several medicines for pain.  Discussed follow-up with spine specialty services and ED return precautions.    Margette Fast, MD 06/12/21 [phone]

## 2021-06-12 NOTE — ED Notes (Signed)
Pt in ct 

## 2021-06-12 NOTE — ED Provider Notes (Signed)
[facility] EMERGENCY DEPARTMENT Provider Note   CSN: [id number] Arrival date & time: 06/12/21  4132     History Chief Complaint  Patient presents with   Bianca Gibson is a 50 y.o. female.  Patient is a 50 year old female with a history of GERD, atrial tachycardia, trigeminal neuralgia who is presenting today with complaints of lumbar back pain after a fall.  Patient works as a Marine scientist in the dialysis unit and they have had a leak and when she was walking across the floor she slipped in the water and reports that her legs did a split with her right going in front and her left leg going behind her.  She did not hit her head when she fell or lose consciousness.  Immediately with the fall she experienced severe lumbar back pain.  She was able to get up but she was having excruciating back pain and she took a few steps and then started feeling very light headed and dizzy.  Her coworkers lowered her to a chair and she had a syncopal event where she became pale, diaphoretic and defecated on herself.  She did not have any chest pain, shortness of breath before or after the event.  She was able to go home but her husband had to significantly help her walk into the house because the pain was severe.  It is currently an 8 out of 10 and is across the whole lumbar spine.  It is exacerbated by trying to lift either of her legs.  She has no numbness in the legs.  She has no thoracic or cervical pain.  Some minimal abdominal pain on the sides coming from the back but no nausea or vomiting.  No prior history of back issues.  She did have a knee surgery on the left knee not too long ago and has noticed there is some pain in her left knee as well since the fall.  She has not taken any medications for pain since this occurred.  The history is provided by the patient.  Fall This is a new problem. The current episode started 1 to 2 hours ago. The problem occurs constantly. The problem has not changed  since onset.Associated symptoms comments: Lumbar back pain. The symptoms are aggravated by bending and twisting. The symptoms are relieved by rest. She has tried rest for the symptoms. The treatment provided no relief.      Past Medical History:  Diagnosis Date   Anxiety    GERD (gastroesophageal reflux disease)    Obesity    Panic attack    Tachycardia    Trigeminal neuralgia     There are no problems to display for this patient.   Past Surgical History:  Procedure Laterality Date   ABDOMINAL HYSTERECTOMY     CHOLECYSTECTOMY     EXPLORATORY LAPAROTOMY     HERNIA REPAIR     KIDNEY STONE SURGERY     KNEE ARTHROSCOPY Left      OB History   No obstetric history on file.     History reviewed. No pertinent family history.  Social History   Tobacco Use   Smoking status: Never   Smokeless tobacco: Never  Substance Use Topics   Alcohol use: Yes    Comment: 1 glass wine/day   Drug use: Yes    Comment: CBD, Delta 8    Home Medications Prior to Admission medications   Medication Sig Start Date End Date Taking? Authorizing Provider  ALPRAZolam (XANAX) 0.5 MG tablet Take 0.5 mg by mouth 3 (three) times daily as needed for anxiety.    [provider]  esomeprazole (NEXIUM) 40 MG capsule Take 40 mg by mouth at bedtime.    [provider]  lamoTRIgine (LAMICTAL) 150 MG tablet Take 150 mg by mouth daily.    [provider]  metoprolol tartrate (LOPRESSOR) 25 MG tablet Take 100 mg by mouth 2 (two) times daily.     [provider]  ondansetron (ZOFRAN ODT) 4 MG disintegrating tablet Take 1 tablet (4 mg total) by mouth every 8 (eight) hours as needed. 12/01/20   Long, Wonda Olds, MD  phenazopyridine (PYRIDIUM) 200 MG tablet Take 1 tablet (200 mg total) by mouth 3 (three) times daily as needed for pain. 12/01/20   Long, Wonda Olds, MD    Allergies    Codeine  Review of Systems   Review of Systems  All other systems reviewed and are  negative.  Physical Exam Updated Vital Signs BP (!) 155/89   Pulse 69   Temp 98 F (36.7 C) (Oral)   Resp 20   Ht 5\' 3"  (1.6 m)   Wt 72.6 kg   SpO2 100%   BMI 28.34 kg/m   Physical Exam Vitals and nursing note reviewed.  Constitutional:      General: She is not in acute distress.    Appearance: Normal appearance. She is well-developed.  HENT:     Head: Normocephalic and atraumatic.  Eyes:     Pupils: Pupils are equal, round, and reactive to light.  Cardiovascular:     Rate and Rhythm: Normal rate and regular rhythm.     Pulses: Normal pulses.     Heart sounds: Normal heart sounds. No murmur heard.   No friction rub.  Pulmonary:     Effort: Pulmonary effort is normal.     Breath sounds: Normal breath sounds. No wheezing or rales.  Abdominal:     General: Bowel sounds are normal. There is no distension.     Palpations: Abdomen is soft.     Tenderness: There is no abdominal tenderness. There is no guarding or rebound.  Musculoskeletal:        General: Tenderness present.     Cervical back: Normal, normal range of motion and neck supple.     Thoracic back: Normal.     Lumbar back: Spasms, tenderness and bony tenderness present. Decreased range of motion.     Comments: No edema.  Tenderness throughout the lumbar spine over the spinous processes as well as in the musculature.  Mild tenderness over the left knee with minimal swelling.  Well-healed surgical scars.  Skin:    General: Skin is warm and dry.     Findings: No rash.  Neurological:     Mental Status: She is alert and oriented to person, place, and time.     Cranial Nerves: No cranial nerve deficit.     Comments: Patient is able to flex at the hip but produces significant pain and does not move her legs much.  Sensation is intact throughout bilateral lower extremities.  Psychiatric:        Mood and Affect: Mood normal.        Behavior: Behavior normal.    ED Results / Procedures / Treatments   Labs (all labs  ordered are listed, but only abnormal results are displayed) Labs Reviewed - No data to display  EKG None  Radiology DG Lumbar Spine Complete  Result Date: 06/12/2021 CLINICAL DATA:  Lower back pain after fall. EXAM: LUMBAR SPINE - COMPLETE 4+ VIEW COMPARISON:  December 06, 2014. FINDINGS: Mild superior endplate depression of L1 vertebral body is noted concerning for acute fracture. No spondylolisthesis is noted. Disc spaces are well-maintained. IMPRESSION: Probable acute mild L1 vertebral body compression fracture. Electronically Signed   By: Marijo Conception M.D.   On: 06/12/2021 11:32   CT Lumbar Spine Wo Contrast  Result Date: 06/12/2021 CLINICAL DATA:  Fall, low back pain. EXAM: CT LUMBAR SPINE WITHOUT CONTRAST TECHNIQUE: Multidetector CT imaging of the lumbar spine was performed without intravenous contrast administration. Multiplanar CT image reconstructions were also generated. COMPARISON:  Radiograph 06/12/2021 and CT abdomen 03/13/2018 FINDINGS: Segmentation: The lowest lumbar type non-rib-bearing vertebra is labeled as L5. Alignment: Unremarkable Vertebrae: Superior endplate compression fracture at L1 with 20% loss of vertebral height. The fracture extends into the posterior margin of the vertebral body but no fracture of the pedicles or other posterior elements at this level are identified. 0.2 cm posterior bony retropulsion. Paraspinal and other soft tissues: Unremarkable Disc levels: No impingement identified. IMPRESSION: 1. 20% superior endplate compression fracture at L1 with extension into the posterior vertebral body margin near the superior endplate but no definite posterior element involvement. Appearance favors AO A3 compression fracture (incomplete burst). Electronically Signed   By: Van Clines M.D.   On: 06/12/2021 12:41   DG Knee Complete 4 Views Left  Result Date: 06/12/2021 CLINICAL DATA:  Pain after fall. EXAM: LEFT KNEE - COMPLETE 4+ VIEW COMPARISON:  None. FINDINGS:  Normal alignment. The vertebral body heights are well preserved. No fracture or dislocation. No signs of joint effusion. Sharpening of the tibial spines. Mild medial compartment narrowing with marginal spur formation is new from previous exam. IMPRESSION: 1. No acute findings. 2. Mild osteoarthritis. Electronically Signed   By: Kerby Moors M.D.   On: 06/12/2021 11:28    Procedures Procedures   Medications Ordered in ED Medications  ketorolac (TORADOL) injection 60 mg (has no administration in time range)  tiZANidine (ZANAFLEX) tablet 4 mg (has no administration in time range)    ED Course  I have reviewed the triage vital signs and the nursing notes.  Pertinent labs & imaging results that were available during my care of the patient were reviewed by me and considered in my medical decision making (see chart for details).    MDM Rules/Calculators/A&P                          Patient presenting today after a mechanical fall but then preceding the fall had severe back pain and then had a syncopal event.  Suspect the syncope was related to pain and vasovagal causes.  She denies any chest pain or shortness of breath.  She is awake alert and well-appearing here.  She does have significant pain which is reproduced with moving either leg or palpation.  We will treat with anti-inflammatories and muscle relaxer.  Plain films are pending.  EKG does show diffuse nonspecific T wave inversion with slight ST depression.  There are no other EKGs in our system but looking in care everywhere she has had EKGs done back to 2017 that showed nonspecific ST changes.  11:59 AM Knee images are negative.  Lumbar films show a probable acute mild L1 vertebral body compression fracture.  Given patient's significant pain it is somewhat improved with Toradol and Flexeril but she is still having  moderate pain and was given Percocet.  We will do a CT to further evaluate.  1:36 PM CT showed 20% superior endplate  compression fracture at L1.  Spoke with Dr. Kathyrn Sheriff with neurosurgery who evaluated patient's images.  He recommends TLSO brace, pain control and follow up.  1:38 PM Pain is improved after pain meds.  Will wait for brace.  Final Clinical Impression(s) / ED Diagnoses Final diagnoses:  None    Rx / DC Orders ED Discharge Orders     None        Blanchie Dessert, MD 06/12/21 [phone]

## 2021-06-12 NOTE — ED Notes (Signed)
Patient transported to X-ray 

## 2021-06-12 NOTE — ED Notes (Signed)
Returned to room from XR

## 2021-06-12 NOTE — ED Notes (Signed)
ED Provider at bedside. 

## 2022-02-27 IMAGING — DX DG KNEE COMPLETE 4+V*L*
4 series · 4 of 4 positions shown · non-contrast
Comparison: None.

CLINICAL DATA: Pain after fall.

EXAM:
LEFT KNEE - COMPLETE 4+ VIEW

[knee ap]
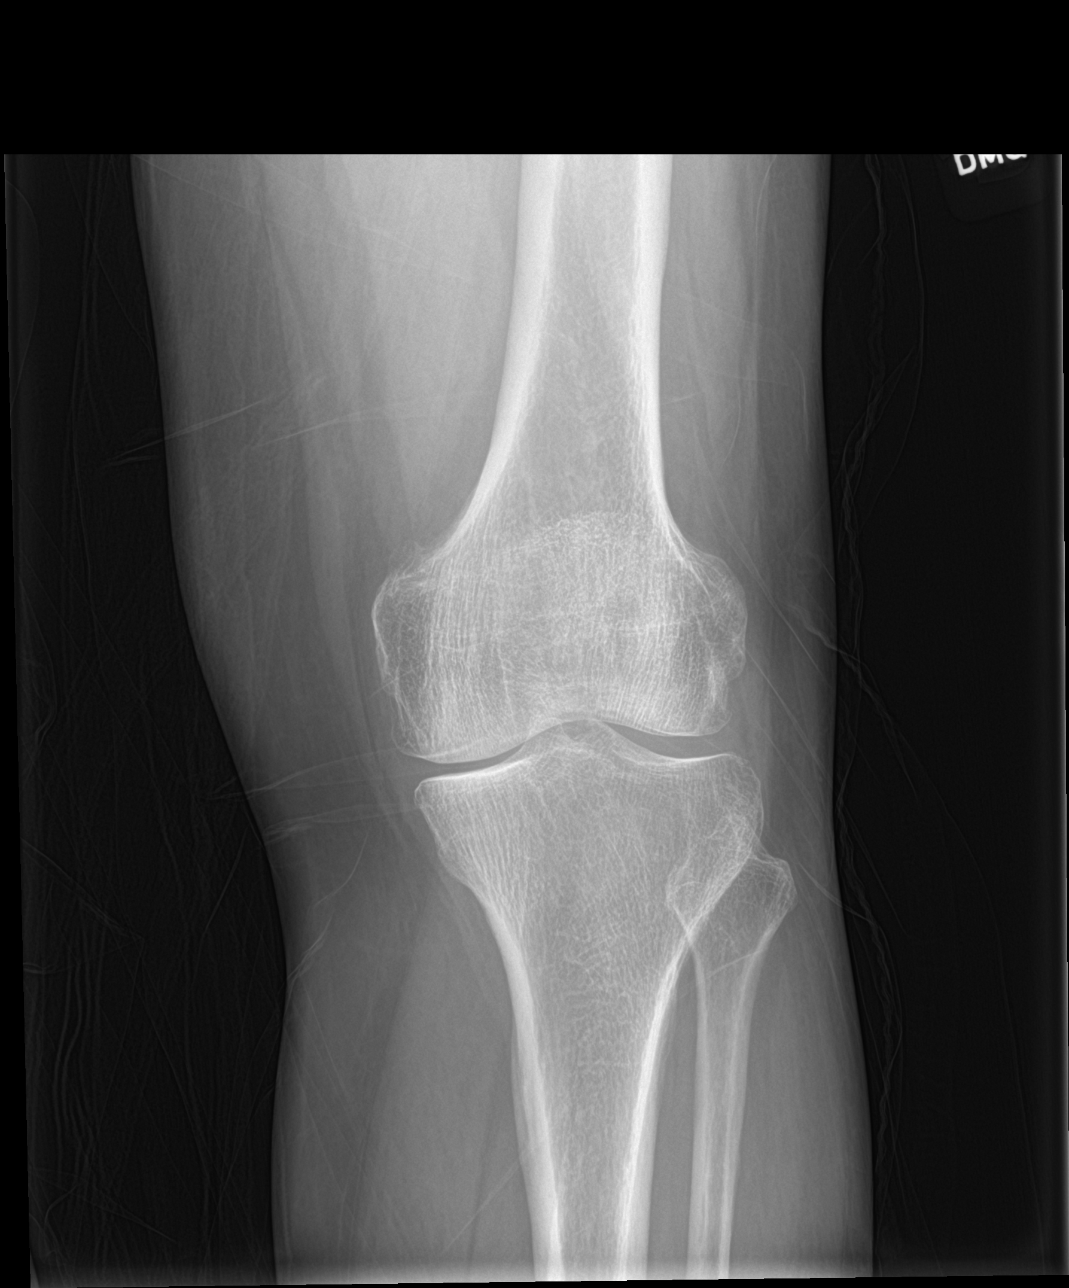

[knee lat]
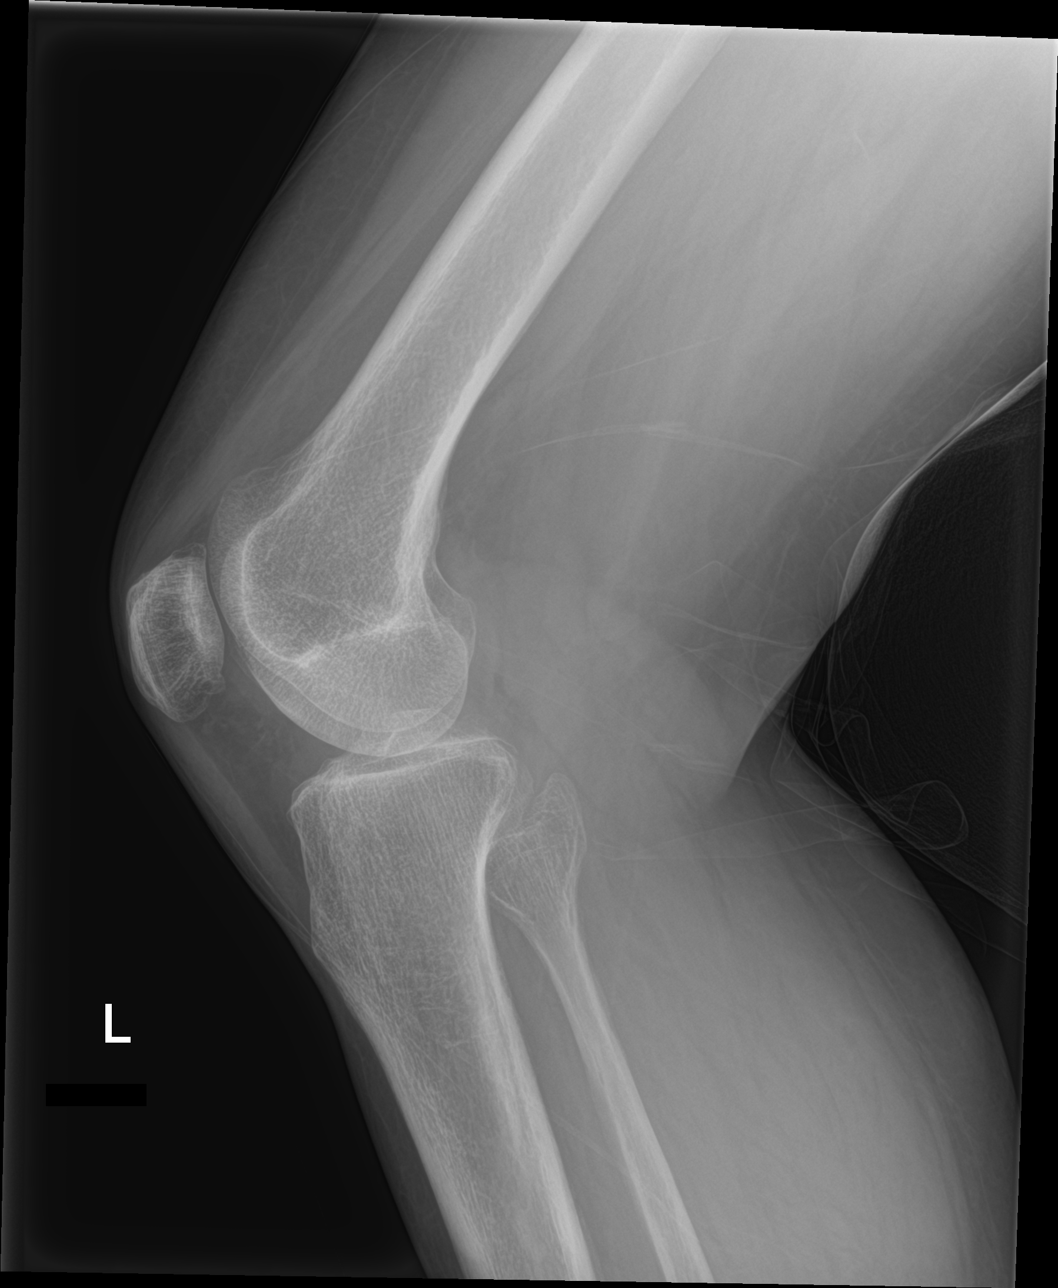

[knee obl (1 of 2)]
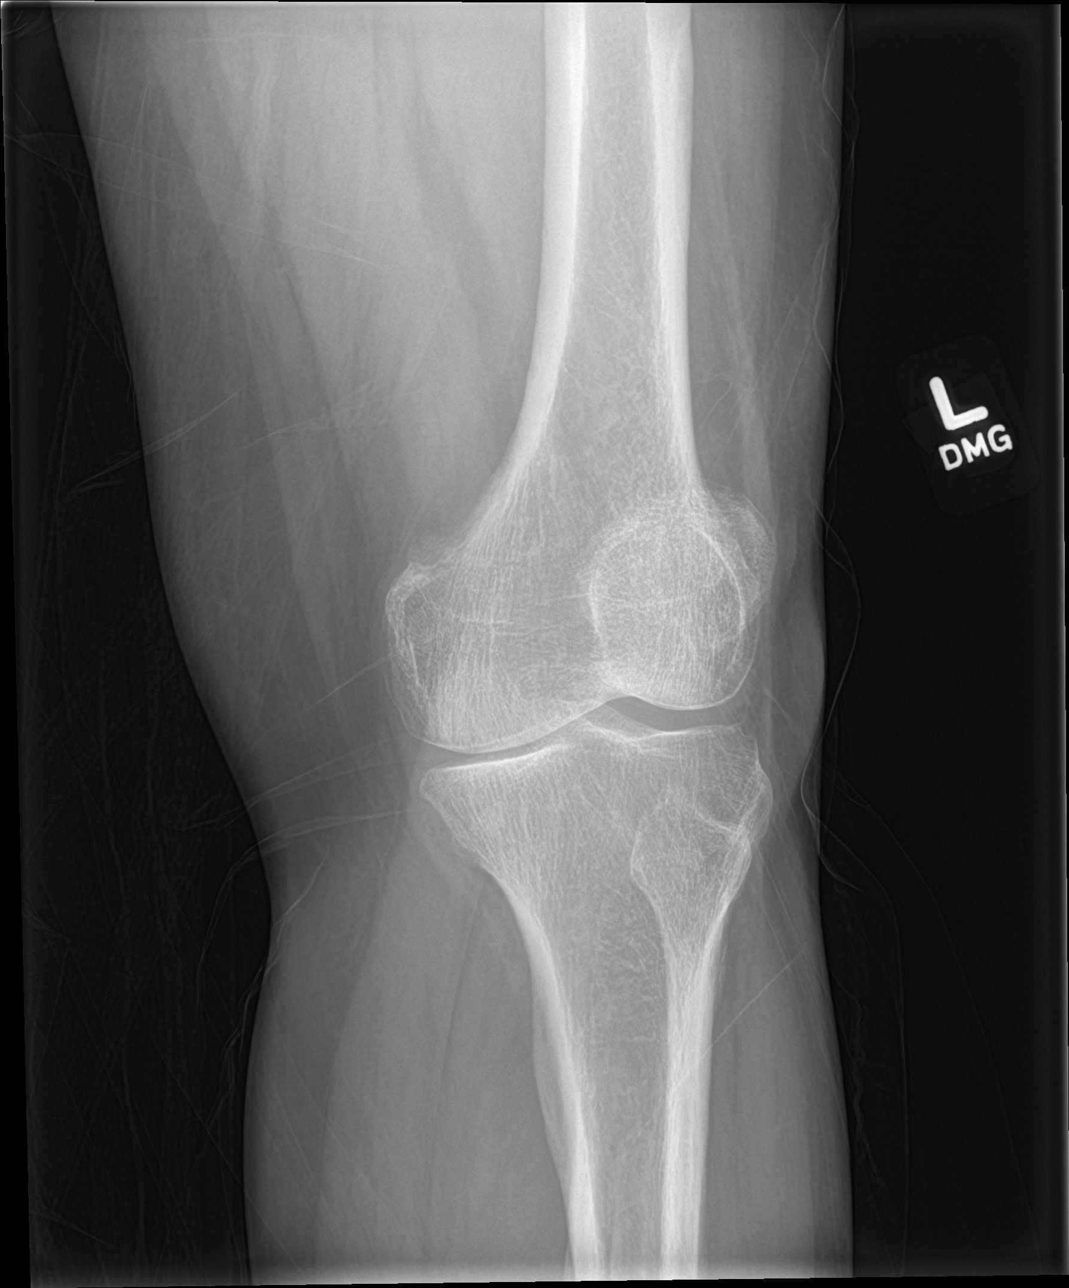

[knee obl (2 of 2)]
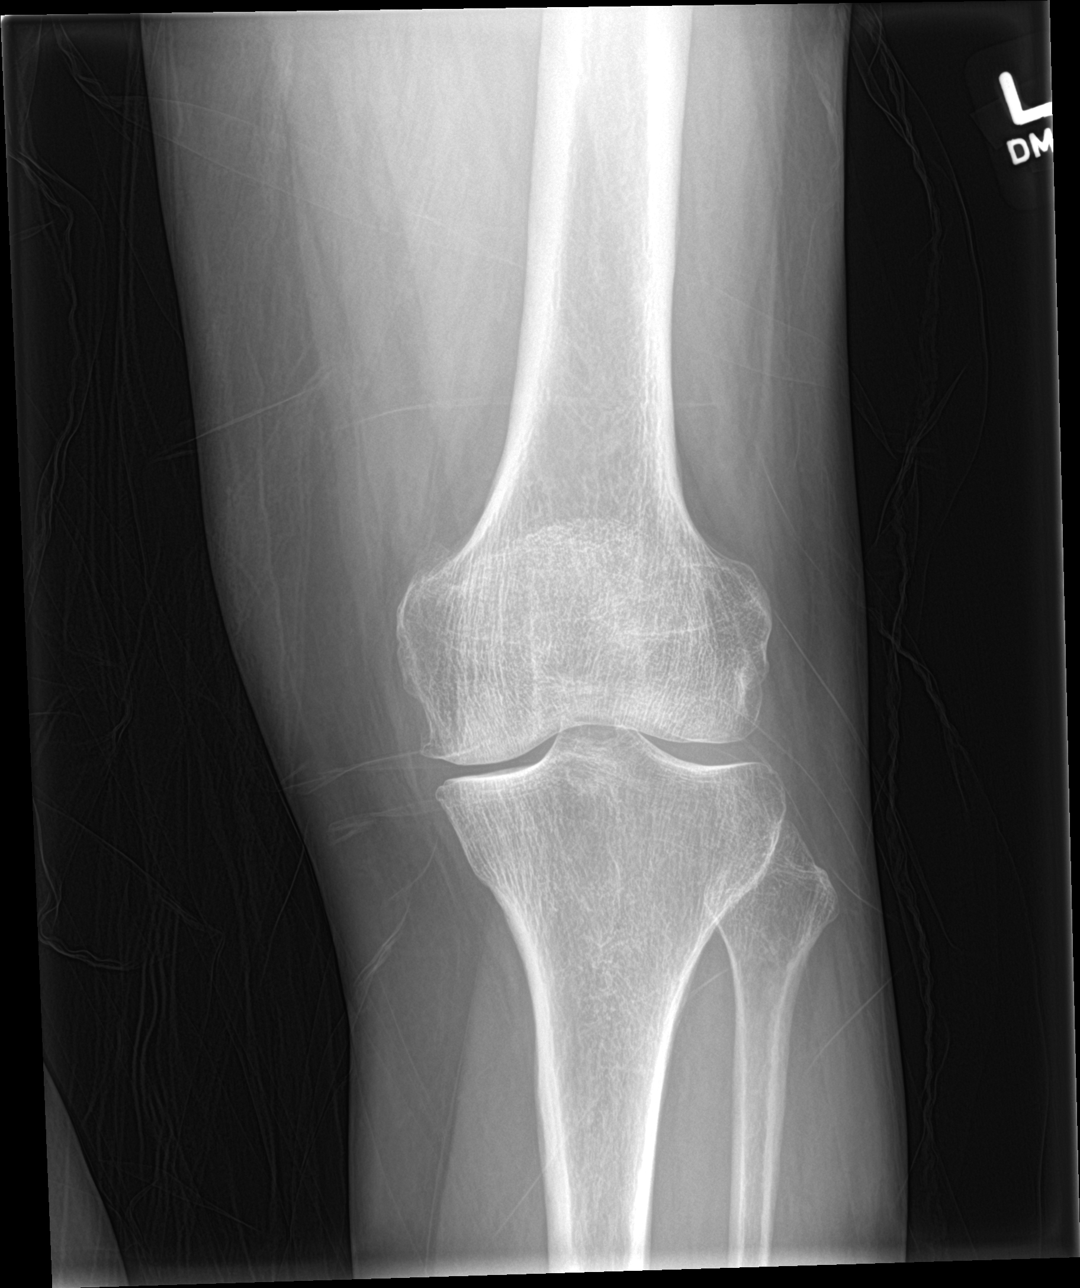

[4 of 4 positions shown; findings below may reference images not displayed]

FINDINGS: Normal alignment. The vertebral body heights are well preserved. No
fracture or dislocation. No signs of joint effusion. Sharpening of
the tibial spines. Mild medial compartment narrowing with marginal
spur formation is new from previous exam.
IMPRESSION: 1. No acute findings.
2. Mild osteoarthritis.

## 2022-07-26 ENCOUNTER — Other Ambulatory Visit: Payer: Self-pay | Admitting: Orthopedic Surgery

## 2022-08-12 NOTE — Patient Instructions (Signed)
SURGICAL WAITING ROOM VISITATION Patients having surgery or a procedure may have no more than 2 support people in the waiting area - these visitors may rotate.   Children under the age of 85 must have an adult with them who is not the patient. If the patient needs to stay at the hospital during part of their recovery, the visitor guidelines for inpatient rooms apply. Pre-op nurse will coordinate an appropriate time for 1 support person to accompany patient in pre-op.  This support person may not rotate.    Please refer to the [facility] website for the visitor guidelines for Inpatients (after your surgery is over and you are in a regular room).    Your procedure is scheduled on: 08/29/22   Report to [facility] Main Entrance    Report to admitting at 5:15 AM   Call this number if you have problems the morning of surgery [phone]   Do not eat food :After Midnight.   After Midnight you may have the following liquids until 4:15 AM DAY OF SURGERY  Water Non-Citrus Juices (without pulp, NO RED) Carbonated Beverages Black Coffee (NO MILK/CREAM OR CREAMERS, sugar ok)  Clear Tea (NO MILK/CREAM OR CREAMERS, sugar ok) regular and decaf                             Plain Jell-O (NO RED)                                           Fruit ices (not with fruit pulp, NO RED)                                     Popsicles (NO RED)                                                               Sports drinks like Gatorade (NO RED)                 The day of surgery:  Drink ONE (1) Pre-Surgery Clear Ensure at 4:15 AM the morning of surgery. Drink in one sitting. Do not sip.  This drink was given to you during your hospital  pre-op appointment visit. Nothing else to drink after completing the  Pre-Surgery Clear Ensure.          If you have questions, please contact your surgeon's office.   FOLLOW BOWEL PREP AND ANY ADDITIONAL PRE OP INSTRUCTIONS YOU RECEIVED FROM YOUR SURGEON'S OFFICE!!!      Oral Hygiene is also important to reduce your risk of infection.                                    Remember - BRUSH YOUR TEETH THE MORNING OF SURGERY WITH YOUR REGULAR TOOTHPASTE   Take these medicines the morning of surgery with A SIP OF WATER: Tylenol, Buspirone, Metoprolol, Lyrica  DO NOT TAKE ANY ORAL DIABETIC MEDICATIONS DAY OF YOUR SURGERY  Bring CPAP mask  and tubing day of surgery.                              You may not have any metal on your body including hair pins, jewelry, and body piercing             Do not wear make-up, lotions, powders, perfumes, or deodorant  Do not wear nail polish including gel and S&S, artificial/acrylic nails, or any other type of covering on natural nails including finger and toenails. If you have artificial nails, gel coating, etc. that needs to be removed by a nail salon please have this removed prior to surgery or surgery may need to be canceled/ delayed if the surgeon/ anesthesia feels like they are unable to be safely monitored.   Do not shave  48 hours prior to surgery.    Do not bring valuables to the hospital. [place].  DO NOT [place]. PHARMACY WILL DISPENSE MEDICATIONS LISTED ON YOUR MEDICATION LIST TO YOU DURING YOUR ADMISSION Bianca Gibson!    Patients discharged on the day of surgery will not be allowed to drive home.  Someone NEEDS to stay with you for the first 24 hours after anesthesia.   Special Instructions: Bring a copy of your healthcare power of attorney and living will documents         the day of surgery if you haven't scanned them before.              Please read over the following fact sheets you were given: IF YOU HAVE QUESTIONS ABOUT YOUR PRE-OP INSTRUCTIONS PLEASE CALL [facility] - Preparing for Surgery Before surgery, you can play an important role.  Because skin is not sterile, your skin needs to be  as free of germs as possible.  You can reduce the number of germs on your skin by washing with CHG (chlorahexidine gluconate) soap before surgery.  CHG is an antiseptic cleaner which kills germs and bonds with the skin to continue killing germs even after washing. Please DO NOT use if you have an allergy to CHG or antibacterial soaps.  If your skin becomes reddened/irritated stop using the CHG and inform your nurse when you arrive at Short Stay. Do not shave (including legs and underarms) for at least 48 hours prior to the first CHG shower.  You may shave your face/neck.  Please follow these instructions carefully:  1.  Shower with CHG Soap the night before surgery and the  morning of surgery.  2.  If you choose to wash your hair, wash your hair first as usual with your normal  shampoo.  3.  After you shampoo, rinse your hair and body thoroughly to remove the shampoo.                             4.  Use CHG as you would any other liquid soap.  You can apply chg directly to the skin and wash.  Gently with a scrungie or clean washcloth.  5.  Apply the CHG Soap to your body ONLY FROM THE NECK DOWN.   Do   not use on face/ open  Wound or open sores. Avoid contact with eyes, ears mouth and   genitals (private parts).                       Wash face,  Genitals (private parts) with your normal soap.             6.  Wash thoroughly, paying special attention to the area where your    surgery  will be performed.  7.  Thoroughly rinse your body with warm water from the neck down.  8.  DO NOT shower/wash with your normal soap after using and rinsing off the CHG Soap.                9.  Pat yourself dry with a clean towel.            10.  Wear clean pajamas.            11.  Place clean sheets on your bed the night of your first shower and do not  sleep with pets. Day of Surgery : Do not apply any lotions/deodorants the morning of surgery.  Please wear clean clothes to the  hospital/surgery center.  FAILURE TO FOLLOW THESE INSTRUCTIONS MAY RESULT IN THE CANCELLATION OF YOUR SURGERY  PATIENT SIGNATURE_________________________________  NURSE SIGNATURE__________________________________  ________________________________________________________________________   Bianca Gibson  An incentive spirometer is a tool that can help keep your lungs clear and active. This tool measures how well you are filling your lungs with each breath. Taking long deep breaths may help reverse or decrease the chance of developing breathing (pulmonary) problems (especially infection) following: A long period of time when you are unable to move or be active. BEFORE THE PROCEDURE  If the spirometer includes an indicator to show your best effort, your nurse or respiratory therapist will set it to a desired goal. If possible, sit up straight or lean slightly forward. Try not to slouch. Hold the incentive spirometer in an upright position. INSTRUCTIONS FOR USE  Sit on the edge of your bed if possible, or sit up as far as you can in bed or on a chair. Hold the incentive spirometer in an upright position. Breathe out normally. Place the mouthpiece in your mouth and seal your lips tightly around it. Breathe in slowly and as deeply as possible, raising the piston or the ball toward the top of the column. Hold your breath for 3-5 seconds or for as long as possible. Allow the piston or ball to fall to the bottom of the column. Remove the mouthpiece from your mouth and breathe out normally. Rest for a few seconds and repeat Steps 1 through 7 at least 10 times every 1-2 hours when you are awake. Take your time and take a few normal breaths between deep breaths. The spirometer may include an indicator to show your best effort. Use the indicator as a goal to work toward during each repetition. After each set of 10 deep breaths, practice coughing to be sure your lungs are clear. If you have an  incision (the cut made at the time of surgery), support your incision when coughing by placing a pillow or rolled up towels firmly against it. Once you are able to get out of bed, walk around indoors and cough well. You may stop using the incentive spirometer when instructed by your caregiver.  RISKS AND COMPLICATIONS Take your time so you do not get dizzy or light-headed. If you are in pain, you  may need to take or ask for pain medication before doing incentive spirometry. It is harder to take a deep breath if you are having pain. AFTER USE Rest and breathe slowly and easily. It can be helpful to keep track of a log of your progress. Your caregiver can provide you with a simple table to help with this. If you are using the spirometer at home, follow these instructions: [place] IF:  You are having difficultly using the spirometer. You have trouble using the spirometer as often as instructed. Your pain medication is not giving enough relief while using the spirometer. You develop fever of 100.5 F (38.1 C) or higher. SEEK IMMEDIATE MEDICAL CARE IF:  You cough up bloody sputum that had not been present before. You develop fever of 102 F (38.9 C) or greater. You develop worsening pain at or near the incision site. MAKE SURE YOU:  Understand these instructions. Will watch your condition. Will get help right away if you are not doing well or get worse. Document Released: 04/10/2007 Document Revised: 02/20/2012 Document Reviewed: 06/11/2007 [facility] Patient Information 2014 [place], [place].   ________________________________________________________________________

## 2022-08-12 NOTE — Progress Notes (Signed)
COVID Vaccine Completed:  Date of COVID positive in last 90 days:  PCP - Benjamine Sprague, MD Cardiologist -   Chest x-ray -  EKG - 07/28/22 req [facility] Stress Test -  ECHO -  Cardiac Cath -  Pacemaker/ICD device last checked: Spinal Cord Stimulator:  Bowel Prep -   Sleep Study -  CPAP -   Fasting Blood Sugar -  Checks Blood Sugar _____ times a day  Blood Thinner Instructions: Aspirin Instructions: Last Dose:  Activity level:  Can go up a flight of stairs and perform activities of daily living without stopping and without symptoms of chest pain or shortness of breath.  Able to exercise without symptoms  Unable to go up a flight of stairs without symptoms of     Anesthesia review:   Patient denies shortness of breath, fever, cough and chest pain at PAT appointment  Patient verbalized understanding of instructions that were given to them at the PAT appointment. Patient was also instructed that they will need to review over the PAT instructions again at home before surgery.

## 2022-08-16 ENCOUNTER — Encounter (HOSPITAL_COMMUNITY)
Admission: RE | Admit: 2022-08-16 | Discharge: 2022-08-16 | Disposition: A | Discharge status: Home / Self Care | Payer: Worker's Compensation | Source: Ambulatory Visit | Attending: Orthopedic Surgery | Admitting: Orthopedic Surgery

## 2022-08-16 ENCOUNTER — Encounter (HOSPITAL_COMMUNITY): Payer: Self-pay

## 2022-08-16 ENCOUNTER — Ambulatory Visit (HOSPITAL_COMMUNITY)
Admission: RE | Admit: 2022-08-16 | Discharge: 2022-08-16 | Disposition: A | Discharge status: Home / Self Care | Payer: Self-pay | Source: Ambulatory Visit | Attending: Orthopedic Surgery | Admitting: Orthopedic Surgery

## 2022-08-16 VITALS — BP 139/88 | HR 120 | Temp 98.6°F | Ht 63.0 in | Wt 185.0 lb

## 2022-08-16 DIAGNOSIS — Z01818 Encounter for other preprocedural examination: Secondary | ICD-10-CM | POA: Insufficient documentation

## 2022-08-16 DIAGNOSIS — R000 Tachycardia, unspecified: Secondary | ICD-10-CM | POA: Insufficient documentation

## 2022-08-16 HISTORY — DX: Unspecified osteoarthritis, unspecified site: M19.90

## 2022-08-16 HISTORY — DX: Personal history of urinary calculi: Z87.442

## 2022-08-16 HISTORY — DX: Pneumonia, unspecified organism: J18.9

## 2022-08-16 LAB — CBC
HCT: 45.3 % (ref 36.0–46.0)
Hemoglobin: 14.5 g/dL (ref 12.0–15.0)
MCH: 31.5 pg (ref 26.0–34.0)
MCHC: 32 g/dL (ref 30.0–36.0)
MCV: 98.5 fL (ref 80.0–100.0)
Platelets: 280 10*3/uL (ref 150–400)
RBC: 4.6 MIL/uL (ref 3.87–5.11)
RDW: 12.4 % (ref 11.5–15.5)
WBC: 4.4 10*3/uL (ref 4.0–10.5)
nRBC: 0 % (ref 0.0–0.2)

## 2022-08-16 LAB — SURGICAL PCR SCREEN
MRSA, PCR: NEGATIVE
Staphylococcus aureus: NEGATIVE

## 2022-08-28 NOTE — Anesthesia Preprocedure Evaluation (Addendum)
Anesthesia Evaluation  Patient identified by MRN, date of birth, ID band Patient awake    Reviewed: Allergy & Precautions, NPO status , Patient's Chart, lab work & pertinent test results  History of Anesthesia Complications Negative for: history of anesthetic complications  Airway Mallampati: II  TM Distance: >3 FB Neck ROM: Full    Dental   Pulmonary neg pulmonary ROS,    Pulmonary exam normal        Cardiovascular Normal cardiovascular exam+ dysrhythmias (tachycardia, on metoprolol)      Neuro/Psych Anxiety negative neurological ROS     GI/Hepatic Neg liver ROS, GERD  ,  Endo/Other  negative endocrine ROS  Renal/GU negative Renal ROS  negative genitourinary   Musculoskeletal negative musculoskeletal ROS (+)   Abdominal   Peds  Hematology negative hematology ROS (+)   Anesthesia Other Findings   Reproductive/Obstetrics                            Anesthesia Physical Anesthesia Plan  ASA: 2  Anesthesia Plan: Spinal   Post-op Pain Management: Tylenol PO (pre-op)*, Toradol IV (intra-op)* and Regional block*   Induction:   PONV Risk Score and Plan: 2 and Propofol infusion, Treatment may vary due to age or medical condition, Ondansetron and TIVA  Airway Management Planned: Nasal Cannula and Simple Face Mask  Additional Equipment: None  Intra-op Plan:   Post-operative Plan:   Informed Consent: I have reviewed the patients History and Physical, chart, labs and discussed the procedure including the risks, benefits and alternatives for the proposed anesthesia with the patient or authorized representative who has indicated his/her understanding and acceptance.       Plan Discussed with:   Anesthesia Plan Comments:        Anesthesia Quick Evaluation

## 2022-08-29 ENCOUNTER — Ambulatory Visit (HOSPITAL_BASED_OUTPATIENT_CLINIC_OR_DEPARTMENT_OTHER): Payer: Worker's Compensation | Admitting: Anesthesiology

## 2022-08-29 ENCOUNTER — Ambulatory Visit (HOSPITAL_COMMUNITY): Payer: Worker's Compensation | Admitting: Anesthesiology

## 2022-08-29 ENCOUNTER — Ambulatory Visit (HOSPITAL_COMMUNITY)
Admission: RE | Admit: 2022-08-29 | Discharge: 2022-08-29 | Disposition: A | Discharge status: Home / Self Care | Payer: Worker's Compensation | Source: Ambulatory Visit | Attending: Orthopedic Surgery | Admitting: Orthopedic Surgery

## 2022-08-29 ENCOUNTER — Encounter (HOSPITAL_COMMUNITY): Payer: Self-pay | Admitting: Orthopedic Surgery

## 2022-08-29 ENCOUNTER — Encounter (HOSPITAL_COMMUNITY)
Admission: RE | Disposition: A | Discharge status: Home / Self Care | Payer: Self-pay | Source: Ambulatory Visit | Attending: Orthopedic Surgery

## 2022-08-29 ENCOUNTER — Other Ambulatory Visit: Payer: Self-pay

## 2022-08-29 DIAGNOSIS — M1712 Unilateral primary osteoarthritis, left knee: Secondary | ICD-10-CM | POA: Insufficient documentation

## 2022-08-29 DIAGNOSIS — E669 Obesity, unspecified: Secondary | ICD-10-CM | POA: Insufficient documentation

## 2022-08-29 DIAGNOSIS — K219 Gastro-esophageal reflux disease without esophagitis: Secondary | ICD-10-CM | POA: Diagnosis not present

## 2022-08-29 DIAGNOSIS — F419 Anxiety disorder, unspecified: Secondary | ICD-10-CM | POA: Diagnosis not present

## 2022-08-29 DIAGNOSIS — Z6832 Body mass index (BMI) 32.0-32.9, adult: Secondary | ICD-10-CM | POA: Diagnosis not present

## 2022-08-29 HISTORY — PX: TOTAL KNEE ARTHROPLASTY: SHX125

## 2022-08-29 SURGERY — ARTHROPLASTY, KNEE, TOTAL
Anesthesia: Spinal | Site: Knee | Laterality: Left

## 2022-08-29 MED ORDER — BUPIVACAINE LIPOSOME 1.3 % IJ SUSP
INTRAMUSCULAR | Status: AC
  Filled 2022-08-29: qty 20

## 2022-08-29 MED ORDER — PHENYLEPHRINE HCL (PRESSORS) 10 MG/ML IV SOLN
INTRAVENOUS | Status: DC | PRN
  Administered 2022-08-29 (×3): 80 ug via INTRAVENOUS
  Administered 2022-08-29 (×3): 160 ug via INTRAVENOUS

## 2022-08-29 MED ORDER — BUPIVACAINE-EPINEPHRINE (PF) 0.25% -1:200000 IJ SOLN
INTRAMUSCULAR | Status: AC
  Filled 2022-08-29: qty 30

## 2022-08-29 MED ORDER — ROPIVACAINE HCL 5 MG/ML IJ SOLN
INTRAMUSCULAR | Status: DC | PRN
  Administered 2022-08-29: 30 mL via PERINEURAL

## 2022-08-29 MED ORDER — TRANEXAMIC ACID-NACL 1000-0.7 MG/100ML-% IV SOLN: 1000.0000 mg | Freq: Once | INTRAVENOUS | Status: AC

## 2022-08-29 MED ORDER — FENTANYL CITRATE PF 50 MCG/ML IJ SOSY: 25.0000 ug | PREFILLED_SYRINGE | INTRAMUSCULAR | Status: DC | PRN

## 2022-08-29 MED ORDER — BUPIVACAINE-EPINEPHRINE 0.5% -1:200000 IJ SOLN
INTRAMUSCULAR | Status: DC | PRN
  Administered 2022-08-29: 30 mL

## 2022-08-29 MED ORDER — KETOROLAC TROMETHAMINE 30 MG/ML IJ SOLN
INTRAMUSCULAR | Status: AC
  Filled 2022-08-29: qty 1

## 2022-08-29 MED ORDER — MIDAZOLAM HCL 5 MG/5ML IJ SOLN
INTRAMUSCULAR | Status: DC | PRN
  Administered 2022-08-29: 2 mg via INTRAVENOUS

## 2022-08-29 MED ORDER — ONDANSETRON HCL 4 MG/2ML IJ SOLN
4.0000 mg | Freq: Once | INTRAMUSCULAR | Status: DC | PRN
Start: 2022-08-29 — End: 2022-08-29

## 2022-08-29 MED ORDER — SODIUM CHLORIDE 0.9% FLUSH
INTRAVENOUS | Status: DC | PRN
  Administered 2022-08-29: 50 mL

## 2022-08-29 MED ORDER — DOCUSATE SODIUM 100 MG PO CAPS: 100.0000 mg | ORAL_CAPSULE | Freq: Two times a day (BID) | ORAL | 2 refills | Status: AC

## 2022-08-29 MED ORDER — PROPOFOL 1000 MG/100ML IV EMUL
INTRAVENOUS | Status: AC
  Filled 2022-08-29: qty 100

## 2022-08-29 MED ORDER — OXYCODONE HCL 5 MG PO TABS
ORAL_TABLET | ORAL | Status: AC
  Filled 2022-08-29: qty 1

## 2022-08-29 MED ORDER — DEXAMETHASONE SODIUM PHOSPHATE 10 MG/ML IJ SOLN
INTRAMUSCULAR | Status: AC
  Filled 2022-08-29: qty 1

## 2022-08-29 MED ORDER — BUPIVACAINE LIPOSOME 1.3 % IJ SUSP: 20.0000 mL | Freq: Once | INTRAMUSCULAR | Status: DC

## 2022-08-29 MED ORDER — OXYCODONE HCL 5 MG PO TABS: 5.0000 mg | ORAL_TABLET | ORAL | Status: DC | PRN

## 2022-08-29 MED ORDER — SODIUM CHLORIDE (PF) 0.9 % IJ SOLN
INTRAMUSCULAR | Status: AC
  Filled 2022-08-29: qty 50

## 2022-08-29 MED ORDER — ACETAMINOPHEN 500 MG PO TABS
1000.0000 mg | ORAL_TABLET | Freq: Once | ORAL | Status: AC
  Administered 2022-08-29: 1000 mg via ORAL
  Filled 2022-08-29: qty 2

## 2022-08-29 MED ORDER — ONDANSETRON HCL 4 MG/2ML IJ SOLN
INTRAMUSCULAR | Status: AC
  Filled 2022-08-29: qty 2

## 2022-08-29 MED ORDER — WATER FOR IRRIGATION, STERILE IR SOLN
Status: DC | PRN
  Administered 2022-08-29: 2000 mL

## 2022-08-29 MED ORDER — AMISULPRIDE (ANTIEMETIC) 5 MG/2ML IV SOLN: 10.0000 mg | Freq: Once | INTRAVENOUS | Status: DC | PRN

## 2022-08-29 MED ORDER — MIDAZOLAM HCL 2 MG/2ML IJ SOLN
INTRAMUSCULAR | Status: AC
  Filled 2022-08-29: qty 2

## 2022-08-29 MED ORDER — POVIDONE-IODINE 10 % EX SWAB
2.0000 | Freq: Once | CUTANEOUS | Status: AC
  Administered 2022-08-29: 2 via TOPICAL

## 2022-08-29 MED ORDER — PHENYLEPHRINE 80 MCG/ML (10ML) SYRINGE FOR IV PUSH (FOR BLOOD PRESSURE SUPPORT)
PREFILLED_SYRINGE | INTRAVENOUS | Status: AC
  Filled 2022-08-29: qty 10

## 2022-08-29 MED ORDER — CEFAZOLIN SODIUM-DEXTROSE 2-4 GM/100ML-% IV SOLN
2.0000 g | INTRAVENOUS | Status: AC
  Administered 2022-08-29: 2 g via INTRAVENOUS
  Filled 2022-08-29: qty 100

## 2022-08-29 MED ORDER — SODIUM CHLORIDE 0.9 % IR SOLN
Status: DC | PRN
  Administered 2022-08-29: 1000 mL

## 2022-08-29 MED ORDER — METHOCARBAMOL 500 MG PO TABS: 500.0000 mg | ORAL_TABLET | Freq: Four times a day (QID) | ORAL | Status: DC | PRN

## 2022-08-29 MED ORDER — METHOCARBAMOL 500 MG IVPB - SIMPLE MED: 500.0000 mg | Freq: Four times a day (QID) | INTRAVENOUS | Status: DC | PRN

## 2022-08-29 MED ORDER — ONDANSETRON HCL 4 MG/2ML IJ SOLN
INTRAMUSCULAR | Status: DC | PRN
  Administered 2022-08-29: 4 mg via INTRAVENOUS

## 2022-08-29 MED ORDER — CHLORHEXIDINE GLUCONATE 0.12 % MT SOLN: 15.0000 mL | Freq: Once | OROMUCOSAL | Status: DC

## 2022-08-29 MED ORDER — FENTANYL CITRATE (PF) 100 MCG/2ML IJ SOLN
INTRAMUSCULAR | Status: DC | PRN
  Administered 2022-08-29 (×2): 50 ug via INTRAVENOUS

## 2022-08-29 MED ORDER — PROPOFOL 500 MG/50ML IV EMUL
INTRAVENOUS | Status: DC | PRN
  Administered 2022-08-29: 60 ug/kg/min via INTRAVENOUS

## 2022-08-29 MED ORDER — KETOROLAC TROMETHAMINE 30 MG/ML IJ SOLN
INTRAMUSCULAR | Status: DC | PRN
  Administered 2022-08-29: 30 mg via INTRAVENOUS

## 2022-08-29 MED ORDER — OXYCODONE-ACETAMINOPHEN 5-325 MG PO TABS: 1.0000 | ORAL_TABLET | ORAL | 0 refills | Status: DC | PRN

## 2022-08-29 MED ORDER — TRANEXAMIC ACID-NACL 1000-0.7 MG/100ML-% IV SOLN
INTRAVENOUS | Status: AC
  Administered 2022-08-29: 1000 mg via INTRAVENOUS
  Filled 2022-08-29: qty 100

## 2022-08-29 MED ORDER — OXYCODONE HCL 5 MG/5ML PO SOLN: 5.0000 mg | Freq: Once | ORAL | Status: AC | PRN

## 2022-08-29 MED ORDER — TIZANIDINE HCL 2 MG PO TABS: 2.0000 mg | ORAL_TABLET | Freq: Four times a day (QID) | ORAL | 0 refills | Status: AC | PRN

## 2022-08-29 MED ORDER — LACTATED RINGERS IV BOLUS
250.0000 mL | Freq: Once | INTRAVENOUS | Status: AC
  Administered 2022-08-29: 250 mL via INTRAVENOUS

## 2022-08-29 MED ORDER — TRANEXAMIC ACID-NACL 1000-0.7 MG/100ML-% IV SOLN
1000.0000 mg | INTRAVENOUS | Status: AC
  Administered 2022-08-29: 1000 mg via INTRAVENOUS
  Filled 2022-08-29: qty 100

## 2022-08-29 MED ORDER — FENTANYL CITRATE (PF) 100 MCG/2ML IJ SOLN
INTRAMUSCULAR | Status: AC
  Filled 2022-08-29: qty 2

## 2022-08-29 MED ORDER — OXYCODONE HCL 5 MG PO TABS
5.0000 mg | ORAL_TABLET | Freq: Once | ORAL | Status: AC | PRN
  Administered 2022-08-29: 5 mg via ORAL

## 2022-08-29 MED ORDER — ORAL CARE MOUTH RINSE: 15.0000 mL | Freq: Once | OROMUCOSAL | Status: DC

## 2022-08-29 MED ORDER — HYDROMORPHONE HCL 1 MG/ML IJ SOLN
INTRAMUSCULAR | Status: AC
  Administered 2022-08-29: 0.5 mg via INTRAVENOUS
  Filled 2022-08-29: qty 1

## 2022-08-29 MED ORDER — ASPIRIN 325 MG PO TBEC: 325.0000 mg | DELAYED_RELEASE_TABLET | Freq: Two times a day (BID) | ORAL | 0 refills | Status: DC

## 2022-08-29 MED ORDER — LACTATED RINGERS IV SOLN
INTRAVENOUS | Status: DC
  Administered 2022-08-29: 1000 mL via INTRAVENOUS

## 2022-08-29 MED ORDER — LACTATED RINGERS IV BOLUS
500.0000 mL | Freq: Once | INTRAVENOUS | Status: AC
  Administered 2022-08-29: 500 mL via INTRAVENOUS

## 2022-08-29 MED ORDER — 0.9 % SODIUM CHLORIDE (POUR BTL) OPTIME
TOPICAL | Status: DC | PRN
  Administered 2022-08-29: 1000 mL

## 2022-08-29 MED ORDER — DEXAMETHASONE SODIUM PHOSPHATE 10 MG/ML IJ SOLN
INTRAMUSCULAR | Status: DC | PRN
  Administered 2022-08-29: 8 mg via INTRAVENOUS

## 2022-08-29 MED ORDER — HYDROMORPHONE HCL 1 MG/ML IJ SOLN
0.5000 mg | INTRAMUSCULAR | Status: DC | PRN
  Administered 2022-08-29: 0.5 mg via INTRAVENOUS

## 2022-08-29 MED ORDER — PHENYLEPHRINE HCL-NACL 20-0.9 MG/250ML-% IV SOLN
INTRAVENOUS | Status: AC
  Filled 2022-08-29: qty 250

## 2022-08-29 MED ORDER — BUPIVACAINE LIPOSOME 1.3 % IJ SUSP
INTRAMUSCULAR | Status: DC | PRN
  Administered 2022-08-29: 20 mL

## 2022-08-29 MED ORDER — BUPIVACAINE IN DEXTROSE 0.75-8.25 % IT SOLN
INTRATHECAL | Status: DC | PRN
  Administered 2022-08-29: 1.8 mL via INTRATHECAL

## 2022-08-29 SURGICAL SUPPLY — 53 items
ATTUNE PSFEM LTSZ4 NARCEM KNEE (Femur) IMPLANT
ATTUNE PSRP INSR SZ4 6 KNEE (Insert) IMPLANT
BAG COUNTER SPONGE SURGICOUNT (BAG) IMPLANT
BAG ZIPLOCK 12X15 (MISCELLANEOUS) ×1 IMPLANT
BASEPLATE TIBIAL ROTATING SZ 4 (Knees) IMPLANT
BENZOIN TINCTURE PRP APPL 2/3 (GAUZE/BANDAGES/DRESSINGS) ×1 IMPLANT
BLADE SAGITTAL 25.0X1.19X90 (BLADE) ×1 IMPLANT
BLADE SAW SGTL 13.0X1.19X90.0M (BLADE) ×1 IMPLANT
BLADE SURG SZ10 CARB STEEL (BLADE) ×2 IMPLANT
BNDG ELASTIC 6X5.8 VLCR STR LF (GAUZE/BANDAGES/DRESSINGS) ×1 IMPLANT
BOOTIES KNEE HIGH SLOAN (MISCELLANEOUS) ×1 IMPLANT
BOWL SMART MIX CTS (DISPOSABLE) ×1 IMPLANT
CEMENT HV SMART SET (Cement) ×2 IMPLANT
CLOSURE STERI STRIP 1/2 X4 (GAUZE/BANDAGES/DRESSINGS) IMPLANT
COVER SURGICAL LIGHT HANDLE (MISCELLANEOUS) ×1 IMPLANT
CUFF TOURN SGL QUICK 34 (TOURNIQUET CUFF) ×1
CUFF TRNQT CYL 34X4.125X (TOURNIQUET CUFF) ×1 IMPLANT
DRAPE INCISE IOBAN 66X45 STRL (DRAPES) ×1 IMPLANT
DRAPE U-SHAPE 47X51 STRL (DRAPES) ×1 IMPLANT
DRSG AQUACEL AG ADV 3.5X10 (GAUZE/BANDAGES/DRESSINGS) ×1 IMPLANT
DURAPREP 26ML APPLICATOR (WOUND CARE) ×1 IMPLANT
ELECT REM PT RETURN 15FT ADLT (MISCELLANEOUS) ×1 IMPLANT
GLOVE BIO SURGEON STRL SZ 6.5 (GLOVE) IMPLANT
GLOVE BIO SURGEON STRL SZ8 (GLOVE) IMPLANT
GLOVE BIOGEL PI IND STRL 7.5 (GLOVE) IMPLANT
GLOVE BIOGEL PI IND STRL 8 (GLOVE) ×2 IMPLANT
GLOVE ECLIPSE 7.5 STRL STRAW (GLOVE) ×2 IMPLANT
GOWN STRL REUS W/ TWL XL LVL3 (GOWN DISPOSABLE) ×2 IMPLANT
GOWN STRL REUS W/TWL XL LVL3 (GOWN DISPOSABLE) ×2
HANDPIECE INTERPULSE COAX TIP (DISPOSABLE) ×1
HOLDER FOLEY CATH W/STRAP (MISCELLANEOUS) IMPLANT
HOOD PEEL AWAY FLYTE STAYCOOL (MISCELLANEOUS) ×3 IMPLANT
KIT TURNOVER KIT A (KITS) IMPLANT
MANIFOLD NEPTUNE II (INSTRUMENTS) ×1 IMPLANT
NEEDLE HYPO 22GX1.5 SAFETY (NEEDLE) ×2 IMPLANT
NS IRRIG 1000ML POUR BTL (IV SOLUTION) ×1 IMPLANT
PACK TOTAL KNEE CUSTOM (KITS) ×1 IMPLANT
PADDING CAST COTTON 6X4 ST (SOFTGOODS) IMPLANT
PADDING CAST COTTON 6X4 STRL (CAST SUPPLIES) ×1 IMPLANT
PATELLA MEDIAL ATTUN 35MM KNEE (Knees) IMPLANT
PROTECTOR NERVE ULNAR (MISCELLANEOUS) ×1 IMPLANT
SET HNDPC FAN SPRY TIP SCT (DISPOSABLE) ×1 IMPLANT
SPIKE FLUID TRANSFER (MISCELLANEOUS) ×2 IMPLANT
STRIP CLOSURE SKIN 1/2X4 (GAUZE/BANDAGES/DRESSINGS) IMPLANT
SUT MNCRL AB 3-0 PS2 18 (SUTURE) ×1 IMPLANT
SUT VIC AB 0 CT1 36 (SUTURE) ×1 IMPLANT
SUT VIC AB 1 CT1 36 (SUTURE) ×2 IMPLANT
SUT VIC AB 3-0 CT1 27 (SUTURE) ×1
SUT VIC AB 3-0 CT1 TAPERPNT 27 (SUTURE) IMPLANT
SYR CONTROL 10ML LL (SYRINGE) ×2 IMPLANT
TRAY FOLEY MTR SLVR 16FR STAT (SET/KITS/TRAYS/PACK) ×1 IMPLANT
WATER STERILE IRR 1000ML POUR (IV SOLUTION) ×2 IMPLANT
WRAP KNEE MAXI GEL POST OP (GAUZE/BANDAGES/DRESSINGS) ×1 IMPLANT

## 2022-08-29 NOTE — Anesthesia Procedure Notes (Signed)
Anesthesia Regional Block: Adductor canal block   Pre-Anesthetic Checklist: , timeout performed,  Correct Patient, Correct Site, Correct Laterality,  Correct Procedure, Correct Position, site marked,  Risks and benefits discussed,  Surgical consent,  Pre-op evaluation,  At surgeon's request and post-op pain management  Laterality: Left  Prep: chloraprep       Needles:  Injection technique: Single-shot  Needle Type: Echogenic Stimulator Needle     Needle Length: 10cm  Needle Gauge: 20     Additional Needles:   Procedures:,,,, ultrasound used (permanent image in chart),,    Narrative:  Start time: 08/29/2022 6:54 AM End time: 08/29/2022 6:57 AM Injection made incrementally with aspirations every 5 mL.  Performed by: Personally  Anesthesiologist: Lidia Collum, MD  Additional Notes: Standard monitors applied. Skin prepped. Good needle visualization with ultrasound. Injection made in 5cc increments with no resistance to injection. Patient tolerated the procedure well.

## 2022-08-29 NOTE — Anesthesia Procedure Notes (Signed)
Spinal  Patient location during procedure: OR Reason for block: surgical anesthesia Staffing Performed: anesthesiologist  Anesthesiologist: Chelsie Burel E, MD Performed by: Montell Leopard E, MD Authorized by: Mateya Torti E, MD   Preanesthetic Checklist Completed: patient identified, IV checked, risks and benefits discussed, surgical consent, monitors and equipment checked, pre-op evaluation and timeout performed Spinal Block Patient position: sitting Prep: DuraPrep and site prepped and draped Patient monitoring: continuous pulse ox, blood pressure and heart rate Approach: midline Location: L3-4 Injection technique: single-shot Needle Needle type: Pencan  Needle gauge: 24 G Needle length: 10 cm Assessment Events: CSF return Additional Notes Functioning IV was confirmed and monitors were applied. Sterile prep and drape, including hand hygiene and sterile gloves were used. The patient was positioned and the spine was prepped. The skin was anesthetized with lidocaine.  Free flow of clear CSF was obtained prior to injecting local anesthetic into the CSF. The needle was carefully withdrawn. The patient tolerated the procedure well.      

## 2022-08-29 NOTE — Anesthesia Postprocedure Evaluation (Signed)
Anesthesia Post Note  Patient: Bianca Gibson  Procedure(s) Performed: TOTAL KNEE ARTHROPLASTY (Left: Knee)     Patient location during evaluation: PACU Anesthesia Type: Spinal Level of consciousness: oriented and awake and alert Pain management: pain level controlled Vital Signs Assessment: post-procedure vital signs reviewed and stable Respiratory status: spontaneous breathing, respiratory function stable and nonlabored ventilation Cardiovascular status: blood pressure returned to baseline and stable Postop Assessment: no headache, no backache, no apparent nausea or vomiting and spinal receding Anesthetic complications: no   No notable events documented.  Last Vitals:  Vitals:   08/29/22 1200 08/29/22 1300  BP: 120/77 (!) 130/93  Pulse: 93 89  Resp: 16 (!) 165  Temp:    SpO2: 97% 94%    Last Pain:  Vitals:   08/29/22 1300  TempSrc:   PainSc: 0-No pain                 Lidia Collum

## 2022-08-29 NOTE — Progress Notes (Signed)
Orthopedic Tech Progress Note Patient Details:  Bianca Gibson [birth year] [id number]  Ortho Devices Type of Ortho Device: Bone foam zero knee Ortho Device/Splint Interventions: Ordered     Bone foam dropped off with PACU RN.  Vernona Rieger 08/29/2022, 10:13 AM

## 2022-08-29 NOTE — Op Note (Signed)
PATIENT ID:      Bianca Gibson  MRN:     [id number] DOB/AGE:    [birth year] / 51 y.o.       OPERATIVE REPORT   DATE OF PROCEDURE:  08/29/2022      PREOPERATIVE DIAGNOSIS:   LEFT KNEE ENDSTAGE ARTHRITIS      Estimated body mass index is 32.77 kg/m as calculated from the following:   Height as of 08/16/22: '5\' 3"'$  (1.6 m).   Weight as of this encounter: 83.9 kg.                                                       POSTOPERATIVE DIAGNOSIS:   Same                                                                  PROCEDURE:  Procedure(s): TOTAL KNEE ARTHROPLASTY Using DepuyAttune RP implants #4N Femur, #4Tibia, 6 mm Attune RP bearing, 35 Patella    SURGEON: Alta Corning  ASSISTANT:   Kerry Hough. [employer]   (Present and scrubbed throughout the case, critical for assistance with exposure, retraction, instrumentation, and closure.)        ANESTHESIA: spinal, 20cc Exparel, 50cc 0.25% Marcaine EBL: min cc FLUID REPLACEMENT: unk cc crystaloid TOURNIQUET: DRAINS: None TRANEXAMIC ACID: 1gm IV, 2gm topical COMPLICATIONS:  None         INDICATIONS FOR PROCEDURE: The patient has  LEFT KNEE ENDSTAGE ARTHRITIS, mild varus deformities, XR shows bone on bone arthritis, lateral subluxation of tibia. Patient has failed all conservative measures including anti-inflammatory medicines, narcotics, attempts at exercise and weight loss, cortisone injections and viscosupplementation.  Risks and benefits of surgery have been discussed, questions answered.   DESCRIPTION OF PROCEDURE: The patient identified by armband, received  IV antibiotics, in the holding area at [facility]. Patient taken to the operating room, appropriate anesthetic monitors were attached, and spinal10 anesthesia was  induced. IV Tranexamic acid was given.Tourniquet applied high to the operative thigh. Lateral post and foot positioner applied to the table, the lower extremity was then prepped and draped in usual sterile fashion from  the toes to the tourniquet. Time-out procedure was performed. Kerry Hough. [facility] PAC, was present and scrubbed throughout the case, critical for assistance with, positioning, exposure, retraction, instrumentation, and closure.The skin and subcutaneous tissue along the incision was injected with 20 cc of a mixture of Exparel and Marcaine solution, using a 20-gauge by 1-1/2 inch needle. We began the operation, with the knee flexed 130 degrees, by making the anterior midline incision starting at handbreadth above the patella going over the patella 1 cm medial to and 4 cm distal to the tibial tubercle. Small bleeders in the skin and the subcutaneous tissue identified and cauterized. Transverse retinaculum was incised and reflected medially and a medial parapatellar arthrotomy was accomplished. the patella was everted and theprepatellar fat pad resected. The superficial medial collateral ligament was then elevated from anterior to posterior along the proximal flare of the tibia and anterior half of the menisci resected. The knee was hyperflexed exposing bone on bone  arthritis. Peripheral and notch osteophytes as well as the cruciate ligaments were then resected. We continued to work our way around posteriorly along the proximal tibia, and externally rotated the tibia subluxing it out from underneath the femur. A McHale PCL retractor was placed through the notch and a lateral Hohmann retractor placed, and we then entered the proximal tibia in line with the Depuy starter drill in line with the axis of the tibia followed by an intramedullary guide rod and 0-degree posterior slope cutting guide. The tibial cutting guide, 4 degree posterior sloped, was pinned into place allowing resection of 2 mm of bone medially and 10 mm of bone laterally. Satisfied with the tibial resection, we then entered the distal femur 2 mm anterior to the PCL origin with the intramedullary guide rod and applied the distal femoral cutting guide set at 9  mm, with 5 degrees of valgus. This was pinned along the epicondylar axis. At this point, the distal femoral cut was accomplished without difficulty. We then sized for a #4N femoral component and pinned the guide in 3 degrees of external rotation. The chamfer cutting guide was pinned into place. The anterior, posterior, and chamfer cuts were accomplished without difficulty followed by the Attune RP box cutting guide and the box cut. We also removed posterior osteophytes from the posterior femoral condyles. The posterior capsule was injected with Exparel solution. The knee was brought into full extension. We checked our extension gap and fit a 6 mm bearing. Distracting in extension with a lamina spreader,  bleeders in the posterior capsule, Posterior medial and posterior lateral gutter were cauterized.  The transexamic acid-soaked sponge was then placed in the gap of the knee in extension. The knee was flexed 30. The posterior patella cut was accomplished with the 9.5 mm Attune cutting guide, sized for a 50m dome, and the fixation pegs drilled.The knee was then once again hyperflexed exposing the proximal tibia. We sized for a # 4 tibial base plate, applied the smokestack and the conical reamer followed by the the Delta fin keel punch. We then hammered into place the Attune RP trial femoral component, drilled the lugs, inserted a  6 mm trial bearing, trial patellar button, and took the knee through range of motion from 0-130 degrees. Medial and lateral ligamentous stability was checked. No thumb pressure was required for patellar Tracking. The tourniquet was released at 45 min. All trial components were removed, mating surfaces irrigated with pulse lavage, and dried with suction and sponges. 10 cc of the Exparel solution was applied to the cancellus bone of the patella distal femur and proximal tibia.  After waiting 30 seconds, the bony surfaces were again, dried with sponges. A double batch of DePuy HV cement was  mixed and applied to all bony metallic mating surfaces except for the posterior condyles of the femur itself. In order, we hammered into place the tibial tray and removed excess cement, the femoral component and removed excess cement. The final Attune RP bearing was inserted, and the knee brought to full extension with compression. The patellar button was clamped into place, and excess cement removed. The knee was held at 30 flexion with compression, while the cement cured. The wound was irrigated out with normal saline solution pulse lavage. The rest of the Exparel was injected into the parapatellar arthrotomy, subcutaneous tissues, and periosteal tissues. The parapatellar arthrotomy was closed with running #1 Vicryl suture. The subcutaneous tissue with 3-0 undyed Vicryl suture, and the skin with running 3-0 SQ  vicryl. An Aquacil and Ace wrap were applied. The patient was taken to recovery room without difficulty.   Alta Corning 08/29/2022, 8:52 AM

## 2022-08-29 NOTE — Evaluation (Signed)
Physical Therapy Evaluation Patient Details Name: Bianca Gibson MRN: [id number] DOB: 02-09-1971 Today's Date: 08/29/2022  History of Present Illness  Pt is a 51yo female presenting s/p L-TKA on 08/29/22. PMH: GERD, tachycardia, "back surgery."  Clinical Impression  Bianca Gibson is a 51 y.o. female POD 0 s/p L-TKA. Patient reports IND with mobility at baseline. Patient is now limited by functional impairments (see PT problem list below) and requires min guard for transfers and gait with RW. Patient was able to ambulate 80 feet with RW and min guard and cues for safe walker management. Patient educated on safe sequencing for stair mobility and verbalized safe guarding position for people assisting with mobility. Patient instructed in exercises to facilitate ROM and circulation. Patient will benefit from continued skilled PT interventions to address impairments and progress towards PLOF. Patient has met mobility goals at adequate level for discharge home; will continue to follow if pt continues acute stay to progress towards Mod I goals.        Recommendations for follow up therapy are one component of a multi-disciplinary discharge planning process, led by the attending physician.  Recommendations may be updated based on patient status, additional functional criteria and insurance authorization.  Follow Up Recommendations Follow physician's recommendations for discharge plan and follow up therapies      Assistance Recommended at Discharge Set up Supervision/Assistance  Patient can return home with the following  A little help with walking and/or transfers;A little help with bathing/dressing/bathroom;Assistance with cooking/housework;Assist for transportation;Help with stairs or ramp for entrance    Equipment Recommendations None recommended by PT  Recommendations for Other Services       Functional Status Assessment Patient has had a recent decline in their functional status and  demonstrates the ability to make significant improvements in function in a reasonable and predictable amount of time.     Precautions / Restrictions Precautions Precautions: Knee Precaution Comments: no pillow under the knee Restrictions Weight Bearing Restrictions: No Other Position/Activity Restrictions: wbat      Mobility  Bed Mobility Overal bed mobility: Needs Assistance Bed Mobility: Supine to Sit     Supine to sit: Supervision     General bed mobility comments: For safety only, no physical assist provided    Transfers Overall transfer level: Needs assistance Equipment used: Rolling walker (2 wheels) Transfers: Sit to/from Stand Sit to Stand: Min guard, From elevated surface           General transfer comment: For safety only, VCs for hand placement, from stretcher.    Ambulation/Gait Ambulation/Gait assistance: Min guard Gait Distance (Feet): 80 Feet Assistive device: Rolling walker (2 wheels) Gait Pattern/deviations: Step-to pattern, Step-through pattern Gait velocity: decreased     General Gait Details: Pt ambulated with RW and min guard, no physical assist provided or overt LOB noted, VCs for smoothness of AD management. Pt progressed from step-to to step-through pattern.  Stairs Stairs: Yes Stairs assistance: Min guard Stair Management: One rail Left, Step to pattern, Sideways Number of Stairs: 2 General stair comments: Pt educated on stair mobility using L handrail, verbalized understanding. Demonstrated safe technique with min guard, no physical assist required, VCs for sequencing.  Wheelchair Mobility    Modified Rankin (Stroke Patients Only)       Balance Overall balance assessment: Needs assistance Sitting-balance support: Feet supported, No upper extremity supported Sitting balance-Leahy Scale: Good     Standing balance support: Reliant on assistive device for balance, During functional activity, Bilateral upper extremity  supported  Standing balance-Leahy Scale: Poor                               Pertinent Vitals/Pain Pain Assessment Pain Assessment: 0-10 Pain Score: 5  Pain Location: LEFT knee Pain Descriptors / Indicators: Operative site guarding, Cramping, Discomfort Pain Intervention(s): Limited activity within patient's tolerance, Monitored during session, Repositioned    Home Living Family/patient expects to be discharged to:: Private residence Living Arrangements: Spouse/significant other;Children Available Help at Discharge: Family;Available 24 hours/day Type of Home: House Home Access: Stairs to enter Entrance Stairs-Rails: Left Entrance Stairs-Number of Steps: 3   Home Layout: One level Home Equipment: BSC/3in1;Shower seat;Rolling Environmental consultant (2 wheels);Cane - single point      Prior Function Prior Level of Function : Independent/Modified Independent;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)             Mobility Comments: ind ADLs Comments: ind     Journalist, newspaper        Extremity/Trunk Assessment   Upper Extremity Assessment Upper Extremity Assessment: Overall WFL for tasks assessed    Lower Extremity Assessment Lower Extremity Assessment: RLE deficits/detail;LLE deficits/detail RLE Deficits / Details: MMT ank DF/PF 5/5 RLE Sensation: WNL LLE Deficits / Details: MMT ank DF/PF 5/5, no extensor lag noted LLE Sensation: decreased light touch    Cervical / Trunk Assessment Cervical / Trunk Assessment: Kyphotic  Communication   Communication: No difficulties  Cognition Arousal/Alertness: Awake/alert Behavior During Therapy: WFL for tasks assessed/performed Overall Cognitive Status: Within Functional Limits for tasks assessed                                          General Comments General comments (skin integrity, edema, etc.): Husband Ronalee Belts present    Exercises Total Joint Exercises Ankle Circles/Pumps: AROM, Both, 20 reps Quad  Sets: AROM, Both, 5 reps Short Arc Quad: AROM, Left, 5 reps Heel Slides: AROM, Left, 5 reps Hip ABduction/ADduction: AROM, Left, 10 reps Straight Leg Raises: AROM, Left, 5 reps Long Arc Quad: Other (comment) (Educated, not performed) Knee Flexion: Other (comment), PROM, AROM (Educated, not performed) Goniometric ROM: -5-40deg by visual approximation   Assessment/Plan    PT Assessment Patient needs continued PT services  PT Problem List Decreased strength;Decreased range of motion;Decreased activity tolerance;Decreased balance;Decreased mobility;Decreased coordination;Pain       PT Treatment Interventions DME instruction;Gait training;Stair training;Functional mobility training;Therapeutic activities;Therapeutic exercise;Balance training;Neuromuscular re-education;Patient/family education    PT Goals (Current goals can be found in the Care Plan section)  Acute Rehab PT Goals Patient Stated Goal: To walk without pain PT Goal Formulation: With patient Time For Goal Achievement: 09/05/22 Potential to Achieve Goals: Good    Frequency 7X/week     Co-evaluation               AM-PAC PT "6 Clicks" Mobility  Outcome Measure Help needed turning from your back to your side while in a flat bed without using bedrails?: A Little Help needed moving from lying on your back to sitting on the side of a flat bed without using bedrails?: A Little Help needed moving to and from a bed to a chair (including a wheelchair)?: A Little Help needed standing up from a chair using your arms (e.g., wheelchair or bedside chair)?: A Little Help needed to walk in hospital room?: A Little Help needed climbing 3-5 steps  with a railing? : A Little 6 Click Score: 18    End of Session Equipment Utilized During Treatment: Gait belt Activity Tolerance: No increased pain;Patient tolerated treatment well Patient left: in bed;with call bell/phone within reach;with family/visitor present Nurse Communication:  Mobility status PT Visit Diagnosis: Difficulty in walking, not elsewhere classified (R26.2);Pain Pain - Right/Left: Left Pain - part of body: Knee    Time: 1350-1429 PT Time Calculation (min) (ACUTE ONLY): 39 min   Charges:   PT Evaluation $PT Eval Low Complexity: 1 Low PT Treatments $Gait Training: 8-22 mins        Coolidge Breeze, PT, DPT [facility] Rehabilitation Department Office: [phone]  Coolidge Breeze 08/29/2022, 2:33 PM

## 2022-08-29 NOTE — H&P (Addendum)
TOTAL KNEE ADMISSION H&P  Patient is being admitted for left total knee arthroplasty.  Subjective:  Chief Complaint:left knee pain.  HPI: Bianca Gibson, 51 y.o. female, has a history of pain and functional disability in the left knee due to arthritis and has failed non-surgical conservative treatments for greater than 12 weeks to includeNSAID's and/or analgesics, corticosteriod injections, viscosupplementation injections, weight reduction as appropriate, and activity modification.  Onset of symptoms was gradual, starting 2 years ago with gradually worsening course since that time. The patient noted to have had knee arthroscopy with significant chondral injury in the left knee on the left knee(s).  Patient currently rates pain in the left knee(s) at 9 out of 10 with activity. Patient has night pain, worsening of pain with activity and weight bearing, pain that interferes with activities of daily living, and joint swelling.  Patient has evidence of subchondral sclerosis, periarticular osteophytes, and joint subluxation by imaging studies. This patient has had  failure of all reasonable conservative care . There is no active infection.  There are no problems to display for this patient.  Past Medical History:  Diagnosis Date   Anxiety    Arthritis    GERD (gastroesophageal reflux disease)    History of kidney stones    Obesity    Panic attack    Pneumonia    Tachycardia    Trigeminal neuralgia     Past Surgical History:  Procedure Laterality Date   ABDOMINAL HYSTERECTOMY     BACK SURGERY     CHOLECYSTECTOMY     EXPLORATORY LAPAROTOMY     HERNIA REPAIR     KIDNEY STONE SURGERY     KNEE ARTHROSCOPY Left     Current Facility-Administered Medications  Medication Dose Route Frequency Provider Last Rate Last Admin   bupivacaine liposome (EXPAREL) 1.3 % injection 266 mg  20 mL Other Once Dorna Leitz, MD       ceFAZolin (ANCEF) IVPB 2g/100 mL premix  2 g Intravenous On Call to OR Dorna Leitz, MD       chlorhexidine (PERIDEX) 0.12 % solution 15 mL  15 mL Mouth/Throat Once Lidia Collum, MD       Or   Oral care mouth rinse  15 mL Mouth Rinse Once Lidia Collum, MD       lactated ringers infusion   Intravenous Continuous Lidia Collum, MD 100 mL/hr at 08/29/22 0706 Rate Change at 08/29/22 0706   phenylephrine (NEOSYNEPHRINE) 20-0.9 MG/250ML-% infusion            tranexamic acid (CYKLOKAPRON) IVPB 1,000 mg  1,000 mg Intravenous To OR Dorna Leitz, MD       Facility-Administered Medications Ordered in Other Encounters  Medication Dose Route Frequency Provider Last Rate Last Admin   fentaNYL (SUBLIMAZE) injection   Intravenous Anesthesia Intra-op Younger, Forest Gleason, CRNA   50 mcg at 08/29/22 0656   midazolam (VERSED) 5 MG/5ML injection   Intravenous Anesthesia Intra-op Younger, Forest Gleason, CRNA   2 mg at 08/29/22 3614   Allergies  Allergen Reactions   Tramadol Anaphylaxis   Codeine Nausea And Vomiting   Chlorhexidine Rash    Social History   Tobacco Use   Smoking status: Never   Smokeless tobacco: Never  Substance Use Topics   Alcohol use: Yes    Comment: socially    History reviewed. No pertinent family history.   Review of Systems ROS: I have reviewed the patient's review of systems thoroughly and there are  no positive responses as relates to the HPI.  Objective:  Physical Exam  Vital signs in last 24 hours: Temp:  [97.7 F (36.5 C)] 97.7 F (36.5 C) (09/18 0606) Pulse Rate:  [80] 80 (09/18 0606) Resp:  [18] 18 (09/18 0606) BP: (128)/(92) 128/92 (09/18 0606) SpO2:  [98 %] 98 % (09/18 0606) Weight:  [83.9 kg] 83.9 kg (09/18 0546) Well-developed well-nourished patient in no acute distress. Alert and oriented x3 HEENT:within normal limits Cardiac: Regular rate and rhythm Pulmonary: Lungs clear to auscultation Abdomen: Soft and nontender.  Normal active bowel sounds  Musculoskeletal: (Left knee: Painful range of motion.  Limited range of  motion.  No instability.  Trace effusion.  Neurovascular intact distally.)  Labs: Recent Results (from the past 2160 hour(s))  Surgical pcr screen     Status: None   Collection Time: 08/16/22 12:53 PM   Specimen: Nasal Mucosa; Nasal Swab  Result Value Ref Range   MRSA, PCR NEGATIVE NEGATIVE   Staphylococcus aureus NEGATIVE NEGATIVE    Comment: (NOTE) The Xpert SA Assay (FDA approved for NASAL specimens in patients 60 years of age and older), is one component of a comprehensive surveillance program. It is not intended to diagnose infection nor to guide or monitor treatment. Performed at [facility], [place] [address], [place], [place] [address]   CBC per protocol     Status: None   Collection Time: 08/16/22  1:30 PM  Result Value Ref Range   WBC 4.4 4.0 - 10.5 K/uL   RBC 4.60 3.87 - 5.11 MIL/uL   Hemoglobin 14.5 12.0 - 15.0 g/dL   HCT 45.3 36.0 - 46.0 %   MCV 98.5 80.0 - 100.0 fL   MCH 31.5 26.0 - 34.0 pg   MCHC 32.0 30.0 - 36.0 g/dL   RDW 12.4 11.5 - 15.5 %   Platelets 280 150 - 400 K/uL   nRBC 0.0 0.0 - 0.2 %    Comment: Performed at [facility], [place] [address], [place], [place] [address]     Estimated body mass index is 32.77 kg/m as calculated from the following:   Height as of 08/16/22: '5\' 3"'$  (1.6 m).   Weight as of this encounter: 83.9 kg.   Imaging Review Plain radiographs demonstrate severe degenerative joint disease of the left knee(s). The overall alignment ismild varus. The bone quality appears to be fair for age and reported activity level.      Assessment/Plan:  End stage arthritis, left knee   The patient history, physical examination, clinical judgment of the provider and imaging studies are consistent with end stage degenerative joint disease of the left knee(s) and total knee arthroplasty is deemed medically necessary. The treatment options including medical management, injection therapy arthroscopy and arthroplasty  were discussed at length. The risks and benefits of total knee arthroplasty were presented and reviewed. The risks due to aseptic loosening, infection, stiffness, patella tracking problems, thromboembolic complications and other imponderables were discussed. The patient acknowledged the explanation, agreed to proceed with the plan and consent was signed. Patient is being admitted for inpatient treatment for surgery, pain control, PT, OT, prophylactic antibiotics, VTE prophylaxis, progressive ambulation and ADL's and discharge planning. The patient is planning to be discharged home with home health services     Patient's anticipated LOS is less than 2 midnights, meeting these requirements: - Younger than 93 - Lives within 1 hour of care - Has a competent adult at home to recover with post-op recover -  NO history of  - Chronic pain requiring opiods  - Diabetes  - Coronary Artery Disease  - Heart failure  - Heart attack  - Stroke  - DVT/VTE  - Cardiac arrhythmia  - Respiratory Failure/COPD  - Renal failure  - Anemia  - Advanced Liver disease

## 2022-08-29 NOTE — Discharge Instructions (Signed)

## 2022-08-29 NOTE — Transfer of Care (Signed)
Immediate Anesthesia Transfer of Care Note  Patient: Bianca Gibson  Procedure(s) Performed: TOTAL KNEE ARTHROPLASTY (Left: Knee)  Patient Location: PACU  Anesthesia Type:Spinal  Level of Consciousness: awake  Airway & Oxygen Therapy: Patient Spontanous Breathing  Post-op Assessment: Report given to RN  Post vital signs: stable  Last Vitals:  Vitals Value Taken Time  BP 108/64 08/29/22 0930  Temp    Pulse 75 08/29/22 0931  Resp 17 08/29/22 0931  SpO2 95 % 08/29/22 0931  Vitals shown include unvalidated device data.  Last Pain:  Vitals:   08/29/22 0606  TempSrc: Oral         Complications: No notable events documented.

## 2022-08-30 ENCOUNTER — Encounter (HOSPITAL_COMMUNITY): Payer: Self-pay | Admitting: Orthopedic Surgery

## 2022-12-26 ENCOUNTER — Other Ambulatory Visit: Payer: Self-pay

## 2022-12-26 ENCOUNTER — Inpatient Hospital Stay (HOSPITAL_BASED_OUTPATIENT_CLINIC_OR_DEPARTMENT_OTHER)
Admission: EM | Admit: 2022-12-26 | Discharge: 2022-12-28 | DRG: 176 | Disposition: A | Discharge status: Home / Self Care | Payer: 59 | Attending: Family Medicine | Admitting: Family Medicine

## 2022-12-26 ENCOUNTER — Encounter (HOSPITAL_BASED_OUTPATIENT_CLINIC_OR_DEPARTMENT_OTHER): Payer: Self-pay | Admitting: Emergency Medicine

## 2022-12-26 ENCOUNTER — Emergency Department (HOSPITAL_BASED_OUTPATIENT_CLINIC_OR_DEPARTMENT_OTHER): Payer: 59

## 2022-12-26 ENCOUNTER — Encounter (HOSPITAL_COMMUNITY): Payer: Self-pay

## 2022-12-26 DIAGNOSIS — E669 Obesity, unspecified: Secondary | ICD-10-CM | POA: Diagnosis present

## 2022-12-26 DIAGNOSIS — Z79890 Hormone replacement therapy (postmenopausal): Secondary | ICD-10-CM

## 2022-12-26 DIAGNOSIS — I10 Essential (primary) hypertension: Secondary | ICD-10-CM | POA: Diagnosis present

## 2022-12-26 DIAGNOSIS — I2699 Other pulmonary embolism without acute cor pulmonale: Principal | ICD-10-CM | POA: Diagnosis present

## 2022-12-26 DIAGNOSIS — Z79818 Long term (current) use of other agents affecting estrogen receptors and estrogen levels: Secondary | ICD-10-CM

## 2022-12-26 DIAGNOSIS — Z6830 Body mass index (BMI) 30.0-30.9, adult: Secondary | ICD-10-CM

## 2022-12-26 DIAGNOSIS — Z8616 Personal history of COVID-19: Secondary | ICD-10-CM

## 2022-12-26 DIAGNOSIS — F411 Generalized anxiety disorder: Secondary | ICD-10-CM | POA: Diagnosis present

## 2022-12-26 DIAGNOSIS — Z7982 Long term (current) use of aspirin: Secondary | ICD-10-CM

## 2022-12-26 DIAGNOSIS — Z888 Allergy status to other drugs, medicaments and biological substances status: Secondary | ICD-10-CM

## 2022-12-26 DIAGNOSIS — R0789 Other chest pain: Secondary | ICD-10-CM | POA: Diagnosis not present

## 2022-12-26 DIAGNOSIS — Z886 Allergy status to analgesic agent status: Secondary | ICD-10-CM

## 2022-12-26 DIAGNOSIS — U071 COVID-19: Secondary | ICD-10-CM

## 2022-12-26 DIAGNOSIS — Z96652 Presence of left artificial knee joint: Secondary | ICD-10-CM | POA: Diagnosis present

## 2022-12-26 DIAGNOSIS — K219 Gastro-esophageal reflux disease without esophagitis: Secondary | ICD-10-CM | POA: Diagnosis present

## 2022-12-26 DIAGNOSIS — E876 Hypokalemia: Secondary | ICD-10-CM | POA: Diagnosis not present

## 2022-12-26 DIAGNOSIS — I82452 Acute embolism and thrombosis of left peroneal vein: Secondary | ICD-10-CM | POA: Diagnosis present

## 2022-12-26 DIAGNOSIS — G8929 Other chronic pain: Secondary | ICD-10-CM | POA: Diagnosis present

## 2022-12-26 DIAGNOSIS — Z79899 Other long term (current) drug therapy: Secondary | ICD-10-CM

## 2022-12-26 LAB — CBC WITH DIFFERENTIAL/PLATELET
Abs Immature Granulocytes: 0.03 10*3/uL (ref 0.00–0.07)
Basophils Absolute: 0 10*3/uL (ref 0.0–0.1)
Basophils Relative: 0 %
Eosinophils Absolute: 0.1 10*3/uL (ref 0.0–0.5)
Eosinophils Relative: 1 %
HCT: 44.9 % (ref 36.0–46.0)
Hemoglobin: 14.5 g/dL (ref 12.0–15.0)
Immature Granulocytes: 1 %
Lymphocytes Relative: 20 %
Lymphs Abs: 1.2 10*3/uL (ref 0.7–4.0)
MCH: 29.3 pg (ref 26.0–34.0)
MCHC: 32.3 g/dL (ref 30.0–36.0)
MCV: 90.7 fL (ref 80.0–100.0)
Monocytes Absolute: 0.4 10*3/uL (ref 0.1–1.0)
Monocytes Relative: 7 %
Neutro Abs: 4.1 10*3/uL (ref 1.7–7.7)
Neutrophils Relative %: 71 %
Platelets: 319 10*3/uL (ref 150–400)
RBC: 4.95 MIL/uL (ref 3.87–5.11)
RDW: 13.8 % (ref 11.5–15.5)
WBC: 5.8 10*3/uL (ref 4.0–10.5)
nRBC: 0 % (ref 0.0–0.2)

## 2022-12-26 LAB — BASIC METABOLIC PANEL
Anion gap: 11 (ref 5–15)
BUN: 9 mg/dL (ref 6–20)
CO2: 26 mmol/L (ref 22–32)
Calcium: 9.8 mg/dL (ref 8.9–10.3)
Chloride: 102 mmol/L (ref 98–111)
Creatinine, Ser: 0.73 mg/dL (ref 0.44–1.00)
GFR, Estimated: >60 mL/min (ref >60)
Glucose, Bld: 113 mg/dL — ABNORMAL HIGH (ref 70–99)
Potassium: 3.4 mmol/L — ABNORMAL LOW (ref 3.5–5.1)
Sodium: 139 mmol/L (ref 135–145)

## 2022-12-26 LAB — PHOSPHORUS: Phosphorus: 2 mg/dL — ABNORMAL LOW (ref 2.5–4.6)

## 2022-12-26 LAB — TROPONIN I (HIGH SENSITIVITY): Troponin I (High Sensitivity): <2 ng/L (ref <18)

## 2022-12-26 LAB — HEPARIN LEVEL (UNFRACTIONATED): Heparin Unfractionated: 0.53 IU/mL (ref 0.30–0.70)

## 2022-12-26 LAB — MAGNESIUM: Magnesium: 2 mg/dL (ref 1.7–2.4)

## 2022-12-26 MED ORDER — BUSPIRONE HCL 10 MG PO TABS: 10.0000 mg | ORAL_TABLET | Freq: Every day | ORAL | Status: DC | PRN

## 2022-12-26 MED ORDER — TIZANIDINE HCL 2 MG PO TABS
2.0000 mg | ORAL_TABLET | Freq: Four times a day (QID) | ORAL | Status: DC | PRN
  Administered 2022-12-28: 2 mg via ORAL
  Filled 2022-12-26: qty 1

## 2022-12-26 MED ORDER — SODIUM CHLORIDE 0.9 % IV BOLUS
500.0000 mL | Freq: Once | INTRAVENOUS | Status: AC
  Administered 2022-12-26: 500 mL via INTRAVENOUS

## 2022-12-26 MED ORDER — PREGABALIN 75 MG PO CAPS: 150.0000 mg | ORAL_CAPSULE | Freq: Two times a day (BID) | ORAL | Status: DC

## 2022-12-26 MED ORDER — MELATONIN 3 MG PO TABS: 3.0000 mg | ORAL_TABLET | Freq: Every evening | ORAL | Status: DC | PRN

## 2022-12-26 MED ORDER — IOHEXOL 350 MG/ML SOLN
75.0000 mL | Freq: Once | INTRAVENOUS | Status: AC | PRN
  Administered 2022-12-26: 75 mL via INTRAVENOUS

## 2022-12-26 MED ORDER — FENTANYL CITRATE PF 50 MCG/ML IJ SOSY: 50.0000 ug | PREFILLED_SYRINGE | Freq: Once | INTRAMUSCULAR | Status: DC

## 2022-12-26 MED ORDER — METOPROLOL TARTRATE 25 MG PO TABS
25.0000 mg | ORAL_TABLET | Freq: Two times a day (BID) | ORAL | Status: DC
  Administered 2022-12-26 – 2022-12-28 (×5): 25 mg via ORAL
  Filled 2022-12-26 (×5): qty 1

## 2022-12-26 MED ORDER — PREGABALIN 75 MG PO CAPS
150.0000 mg | ORAL_CAPSULE | Freq: Two times a day (BID) | ORAL | Status: DC
  Administered 2022-12-26 – 2022-12-28 (×4): 150 mg via ORAL
  Filled 2022-12-26 (×4): qty 2

## 2022-12-26 MED ORDER — DOCUSATE SODIUM 100 MG PO CAPS
100.0000 mg | ORAL_CAPSULE | Freq: Two times a day (BID) | ORAL | Status: DC
  Administered 2022-12-26 – 2022-12-28 (×4): 100 mg via ORAL
  Filled 2022-12-26 (×4): qty 1

## 2022-12-26 MED ORDER — OXYCODONE HCL 5 MG PO TABS
5.0000 mg | ORAL_TABLET | Freq: Once | ORAL | Status: AC
  Administered 2022-12-26: 5 mg via ORAL
  Filled 2022-12-26: qty 1

## 2022-12-26 MED ORDER — HEPARIN BOLUS VIA INFUSION
5600.0000 [IU] | Freq: Once | INTRAVENOUS | Status: AC
  Administered 2022-12-26: 5600 [IU] via INTRAVENOUS

## 2022-12-26 MED ORDER — ACETAMINOPHEN 650 MG RE SUPP: 650.0000 mg | Freq: Four times a day (QID) | RECTAL | Status: DC | PRN

## 2022-12-26 MED ORDER — K PHOS MONO-SOD PHOS DI & MONO 155-852-130 MG PO TABS
500.0000 mg | ORAL_TABLET | Freq: Three times a day (TID) | ORAL | Status: AC
  Administered 2022-12-26 (×3): 500 mg via ORAL
  Filled 2022-12-26 (×3): qty 2

## 2022-12-26 MED ORDER — ACETAMINOPHEN 325 MG PO TABS
650.0000 mg | ORAL_TABLET | Freq: Four times a day (QID) | ORAL | Status: DC | PRN
  Filled 2022-12-26: qty 2

## 2022-12-26 MED ORDER — ACETAMINOPHEN 500 MG PO TABS
1000.0000 mg | ORAL_TABLET | Freq: Once | ORAL | Status: AC
  Administered 2022-12-26: 1000 mg via ORAL
  Filled 2022-12-26: qty 2

## 2022-12-26 MED ORDER — HEPARIN (PORCINE) 25000 UT/250ML-% IV SOLN: 850.0000 [IU]/h | INTRAVENOUS | Status: DC

## 2022-12-26 MED ORDER — HEPARIN BOLUS VIA INFUSION: 4000.0000 [IU] | Freq: Once | INTRAVENOUS | Status: DC

## 2022-12-26 MED ORDER — HEPARIN (PORCINE) 25000 UT/250ML-% IV SOLN
1200.0000 [IU]/h | INTRAVENOUS | Status: DC
  Administered 2022-12-26 – 2022-12-28 (×3): 1200 [IU]/h via INTRAVENOUS
  Filled 2022-12-26 (×3): qty 250

## 2022-12-26 MED ORDER — OXYCODONE HCL 5 MG PO TABS
5.0000 mg | ORAL_TABLET | ORAL | Status: AC
  Administered 2022-12-26: 5 mg via ORAL
  Filled 2022-12-26: qty 1

## 2022-12-26 MED ORDER — POTASSIUM CHLORIDE CRYS ER 20 MEQ PO TBCR
40.0000 meq | EXTENDED_RELEASE_TABLET | Freq: Once | ORAL | Status: AC
  Administered 2022-12-26: 40 meq via ORAL
  Filled 2022-12-26: qty 2

## 2022-12-26 MED ORDER — HEPARIN (PORCINE) 25000 UT/250ML-% IV SOLN: 12.0000 [IU]/kg/h | INTRAVENOUS | Status: DC

## 2022-12-26 MED ORDER — OXYCODONE-ACETAMINOPHEN 5-325 MG PO TABS
1.0000 | ORAL_TABLET | ORAL | Status: DC | PRN
  Administered 2022-12-27 – 2022-12-28 (×6): 1 via ORAL
  Filled 2022-12-26 (×6): qty 1

## 2022-12-26 MED ORDER — TIZANIDINE HCL 4 MG PO TABS
4.0000 mg | ORAL_TABLET | Freq: Once | ORAL | Status: AC
  Administered 2022-12-26: 4 mg via ORAL
  Filled 2022-12-26: qty 1

## 2022-12-26 MED ORDER — ONDANSETRON HCL 4 MG/2ML IJ SOLN: 4.0000 mg | Freq: Four times a day (QID) | INTRAMUSCULAR | Status: DC | PRN

## 2022-12-26 NOTE — Progress Notes (Signed)
Plan of Care Note for accepted transfer   Patient: Bianca Gibson MRN: [id number]   Kramer: 12/26/2022  Facility requesting transfer: [facility].  Requesting Provider: Lauretta Chester, MD. Reason for transfer: Pulmonary embolism. Facility course:   52 year old female with a past medical history of class I obesity osteoarthritis of the left knee, Bacterial pneumonia, dysmenorrhea, fibroid uterus, herpes zoster opioid use disorder, recurrent nephrolithiasis, viral conjunctivitis, vitamin D deficiency, history of unspecified tachycardia who recently was [place] down with COVID-19 who presented to the emergency department with right-sided chest pain, productive cough with blood-tinged sputum.  Diagnosed with pulmonary embolism with no right heart strain.  Started on heparin per pharmacy.  12/26/22 0535 98.2 F (36.8 C) 115 Abnormal  -- 15 156/87 Abnormal  Sitting 98 % Room Air -- -- -- JG   Magnesium 2.0 mg/dL   Phosphorus [[id number]] (Abnormal)   Collected: 12/26/22 0625   Updated: 12/26/22 0801   Specimen Type: Blood    Phosphorus 2.0 Low  mg/dL  Troponin I (High Sensitivity) [[id number]]   Collected: 12/26/22 0626   Updated: 12/26/22 0656   Specimen Source: Vein    Troponin I (High Sensitivity) <2 ng/L  Basic metabolic panel [[id number]] (Abnormal)   Collected: 12/26/22 0625   Updated: 12/26/22 0643   Specimen Type: Blood   Specimen Source: Vein    Sodium 139 mmol/L   Potassium 3.4 Low  mmol/L   Chloride 102 mmol/L   CO2 26 mmol/L   Glucose, Bld 113 High  mg/dL   BUN 9 mg/dL   Creatinine, Ser 0.73 mg/dL   Calcium 9.8 mg/dL   GFR, Estimated >60 mL/min   CBC with Differential [[id number]]   Collected: 12/26/22 0625   Updated: 12/26/22 0639   Specimen Type: Blood   Specimen Source: Vein    WBC 5.8 K/uL   RBC 4.95 MIL/uL   Hemoglobin 14.5 g/dL   HCT 44.9 %   MCV 90.7 fL   MCH 29.3 pg   MCHC 32.3 g/dL   RDW 13.8 %   Platelets 319 K/uL   nRBC 0.0 %   Neutrophils  Relative % 71 %   Neutro Abs 4.1 K/uL   Lymphocytes Relative 20 %   Lymphs Abs 1.2 K/uL   Monocytes Relative 7 %   Monocytes Absolute 0.4 K/uL   Eosinophils Relative 1 %   Eosinophils Absolute 0.1 K/uL   Basophils Relative 0 %   Basophils Absolute 0.0 K/uL   Immature Granulocytes 1 %   Abs Immature Granulocytes 0.03 K/uL   CTA chest: MPRESSION: 1. Positive for acute left > right lower lobe pulmonary emboli. No saddle embolus or CTA evidence of right heart strain.   2. Probable left lower lobe lateral basal segment pulmonary infarct with small layering left pleural effusion. Superimposed additional bilateral lower lobe and lung base atelectasis, scarring.   Electronically Signed: By: Genevie Ann M.D. On: 12/26/2022 07:06   Plan of care: The patient is accepted for admission to Telemetry unit, at [facility].  Continue heparin per pharmacy.  Potassium and phosphorus replaced.  Echocardiogram requested.  Author: Reubin Milan, MD 12/26/2022  Check [email or web address] for on-call coverage.  Nursing staff, Please call [place] number on Amion as soon as patient's arrival, so appropriate admitting provider can evaluate the pt.

## 2022-12-26 NOTE — ED Notes (Signed)
ED TO INPATIENT HANDOFF REPORT  ED Nurse Name and Phone #: Shayra Anton, RN  S Name/Age/Gender Bianca Gibson 52 y.o. female Room/Bed: MH01/MH01  Code Status   Code Status: Not on file  Home/SNF/Other Home Patient oriented to: self, place, time, and situation Is this baseline? Yes   Triage Complete: Triage complete  Chief Complaint Acute pulmonary embolism ([place]) [I26.99]  Triage Note Pt reports right sided chest pain X2 days.  Pt denies any injury and states the sharp pain goes from under the arm, up the right side of the chest and to the right shoulder.     Allergies Allergies  Allergen Reactions   Tramadol Anaphylaxis   Codeine Nausea And Vomiting   Chlorhexidine Rash    Level of Care/Admitting Diagnosis ED Disposition     ED Disposition  Admit   Condition  --   Comment  Hospital Area: [place] [[id number]]  Level of Care: Telemetry [5]  Admit to tele based on following criteria: Other see comments  Comments: Tachycardia history with tachycardia in the 110s.  Interfacility transfer: Yes  May place patient in observation at [facility] or [place] if equivalent level of care is available:: No  Covid Evaluation: Asymptomatic - no recent exposure (last 10 days) testing not required  Diagnosis: Acute pulmonary embolism [facility]) [[id number]]  Admitting Physician: Reubin Milan [[id number]]  Attending Physician: Reubin Milan [[id number]]          B Medical/Surgery History Past Medical History:  Diagnosis Date   Anxiety    Arthritis    GERD (gastroesophageal reflux disease)    History of kidney stones    Obesity    Panic attack    Pneumonia    Tachycardia    Trigeminal neuralgia    Past Surgical History:  Procedure Laterality Date   ABDOMINAL HYSTERECTOMY     BACK SURGERY     CHOLECYSTECTOMY     EXPLORATORY LAPAROTOMY     HERNIA REPAIR     KIDNEY STONE SURGERY     KNEE ARTHROSCOPY Left    TOTAL KNEE ARTHROPLASTY Left  08/29/2022   Procedure: TOTAL KNEE ARTHROPLASTY;  Surgeon: Dorna Leitz, MD;  Location: [facility];  Service: Orthopedics;  Laterality: Left;     A IV Location/Drains/Wounds Patient Lines/Drains/Airways Status     Active Line/Drains/Airways     Name Placement date Placement time Site Days   Peripheral IV 12/26/22 20 G Left Antecubital 12/26/22  0614  Antecubital  less than 1   Peripheral IV 12/26/22 22 G Posterior;Right Hand 12/26/22  1022  Hand  less than 1   Incision (Closed) 08/29/22 Knee Left 08/29/22  0841  -- 119            Intake/Output Last 24 hours  Intake/Output Summary (Last 24 hours) at 12/26/2022 1759 Last data filed at 12/26/2022 1756 Gross per 24 hour  Intake 177.55 ml  Output --  Net 177.55 ml    Labs/Imaging Results for orders placed or performed during the hospital encounter of 12/26/22 (from the past 48 hour(s))  CBC with Differential     Status: None   Collection Time: 12/26/22  6:25 AM  Result Value Ref Range   WBC 5.8 4.0 - 10.5 K/uL   RBC 4.95 3.87 - 5.11 MIL/uL   Hemoglobin 14.5 12.0 - 15.0 g/dL   HCT 44.9 36.0 - 46.0 %   MCV 90.7 80.0 - 100.0 fL   MCH 29.3 26.0 - 34.0 pg  MCHC 32.3 30.0 - 36.0 g/dL   RDW 13.8 11.5 - 15.5 %   Platelets 319 150 - 400 K/uL   nRBC 0.0 0.0 - 0.2 %   Neutrophils Relative % 71 %   Neutro Abs 4.1 1.7 - 7.7 K/uL   Lymphocytes Relative 20 %   Lymphs Abs 1.2 0.7 - 4.0 K/uL   Monocytes Relative 7 %   Monocytes Absolute 0.4 0.1 - 1.0 K/uL   Eosinophils Relative 1 %   Eosinophils Absolute 0.1 0.0 - 0.5 K/uL   Basophils Relative 0 %   Basophils Absolute 0.0 0.0 - 0.1 K/uL   Immature Granulocytes 1 %   Abs Immature Granulocytes 0.03 0.00 - 0.07 K/uL    Comment: Performed at [facility], [address] [place], [place] [address]  Basic metabolic panel     Status: Abnormal   Collection Time: 12/26/22  6:25 AM  Result Value Ref Range   Sodium 139 135 - 145 mmol/L   Potassium 3.4 (L) 3.5 - 5.1 mmol/L     Comment: HEMOLYSIS AT THIS LEVEL MAY AFFECT RESULT   Chloride 102 98 - 111 mmol/L   CO2 26 22 - 32 mmol/L   Glucose, Bld 113 (H) 70 - 99 mg/dL    Comment: Glucose reference range applies only to samples taken after fasting for at least 8 hours.   BUN 9 6 - 20 mg/dL   Creatinine, Ser 0.73 0.44 - 1.00 mg/dL   Calcium 9.8 8.9 - 10.3 mg/dL   GFR, Estimated >60 >60 mL/min    Comment: (NOTE) Calculated using the CKD-EPI Creatinine Equation (2021)    Anion gap 11 5 - 15    Comment: Performed at [facility], [address] [place], [place] [address]  Magnesium     Status: None   Collection Time: 12/26/22  6:25 AM  Result Value Ref Range   Magnesium 2.0 1.7 - 2.4 mg/dL    Comment: Performed at [facility], [address] [place], [place] [address]  Phosphorus     Status: Abnormal   Collection Time: 12/26/22  6:25 AM  Result Value Ref Range   Phosphorus 2.0 (L) 2.5 - 4.6 mg/dL    Comment: HEMOLYSIS AT THIS LEVEL MAY AFFECT RESULT Performed at [facility], [address] [place], [place] [address]   Troponin I (High Sensitivity)     Status: None   Collection Time: 12/26/22  6:26 AM  Result Value Ref Range   Troponin I (High Sensitivity) <2 <18 ng/L    Comment: (NOTE) Elevated high sensitivity troponin I (hsTnI) values and significant  changes across serial measurements may suggest ACS but many other  chronic and acute conditions are known to elevate hsTnI results.  Refer to the "Links" section for chest pain algorithms and additional  guidance. Performed at [facility], [address] [place], [place] [address]   Heparin level (unfractionated)     Status: None   Collection Time: 12/26/22  3:22 PM  Result Value Ref Range   Heparin Unfractionated 0.53 0.30 - 0.70 IU/mL    Comment: (NOTE) The clinical reportable range upper limit is being lowered to >1.10 to align with the FDA approved guidance for the current laboratory assay.  If  heparin results are below expected values, and patient dosage has  been confirmed, suggest follow up testing of antithrombin III levels. Performed at [facility], [address] [place], [place] [address]    CT Angio Chest PE W and/or Wo Contrast  Addendum Date: 12/26/2022   ADDENDUM REPORT: 12/26/2022 07:12 ADDENDUM: Critical Value/emergent results were called by telephone at the time of interpretation on 12/26/2022 at 0707 hours to Dr. Veatrice Kells , who verbally acknowledged these results. She advises the patient is also COVID-19 positive. Electronically Signed   By: Genevie Ann M.D.   On: 12/26/2022 07:12   Result Date: 12/26/2022 CLINICAL DATA:  52 year old female with shortness of breath, cough, left chest pain. EXAM: CT ANGIOGRAPHY CHEST WITH CONTRAST TECHNIQUE: Multidetector CT imaging of the chest was performed using the standard protocol during bolus administration of intravenous contrast. Multiplanar CT image reconstructions and MIPs were obtained to evaluate the vascular anatomy. RADIATION DOSE REDUCTION: This exam was performed according to the departmental dose-optimization program which includes automated exposure control, adjustment of the mA and/or kV according to patient size and/or use of iterative reconstruction technique. CONTRAST:  42m OMNIPAQUE IOHEXOL 350 MG/ML SOLN COMPARISON:  Chest CTA 01/03/2015. FINDINGS: Cardiovascular: Adequate contrast bolus timing in the pulmonary arterial tree. Left lower lobar pulmonary embolus with filling defect at the branching of the left lower lobe segmental arteries on series 6, image 142. Clot continues distally especially in the medial basal and lateral basal segments with associated downstream airspace disease, see below. No saddle embolus. Small volume contralateral right lower lobe thrombus which appears mostly nonocclusive (series 6, image 123). RV LV ratio 0.77, within normal limits. No cardiomegaly or pericardial effusion. Negative  visible aorta. No calcified coronary artery plaque is evident. Mediastinum/Nodes: No mediastinal mass or lymphadenopathy. Lungs/Pleura: Platelike and wedge-shaped bilateral lower lobe lung opacity, although mostly enhancing. However, there is less enhancement of lateral basal segment left lower lobe confluent opacity which is suspicious for pulmonary infarct (series 4, image 48) and superimposed small layering left pleural effusion with simple fluid density. Major airways remain patent. Superimposed bilateral linear scarring and atelectasis in both middle lobes and along the right minor fissure. No right pleural effusion. Upper Abdomen: Negative visible liver, spleen, pancreas, adrenal glands, bowel and left kidney. Musculoskeletal: No acute or suspicious osseous lesion identified. Incidental bilateral breast implants. Review of the MIP images confirms the above findings. IMPRESSION: 1. Positive for acute left > right lower lobe pulmonary emboli. No saddle embolus or CTA evidence of right heart strain. 2. Probable left lower lobe lateral basal segment pulmonary infarct with small layering left pleural effusion. Superimposed additional bilateral lower lobe and lung base atelectasis, scarring. Electronically Signed: By: HGenevie AnnM.D. On: 12/26/2022 07:06    Pending Labs Unresulted Labs (From admission, onward)     Start     Ordered   12/27/22 0500  Heparin level (unfractionated)  Daily,   R      12/26/22 1713   12/27/22 0500  CBC  Daily,   R      12/26/22 1713   12/26/22 0524  Urinalysis, Routine w reflex microscopic Urine, Clean Catch  Once,   URGENT       Question:  Release to patient  Answer:  Immediate   12/26/22 0523            Vitals/Pain Today's Vitals   12/26/22 1400 12/26/22 1511 12/26/22 1700 12/26/22 1723  BP: (!) 135/90  (!) 149/99   Pulse: 80  99   Resp: 18  13   Temp:  98.4 F (36.9 C)    TempSrc:  Oral    SpO2: 99%  95%   Weight:      Height:      PainSc:    1      Isolation Precautions No active isolations  Medications Medications  heparin ADULT infusion 100 units/mL (25000 units/219m) (1,200 Units/hr Intravenous Infusion Verify 12/26/22 1756)  metoprolol tartrate (LOPRESSOR) tablet 25 mg (25 mg Oral Given 12/26/22 0804)  phosphorus (K PHOS NEUTRAL) tablet 500 mg (500 mg Oral Given 12/26/22 1607)  sodium chloride 0.9 % bolus 500 mL (0 mLs Intravenous Stopped 12/26/22 1139)  iohexol (OMNIPAQUE) 350 MG/ML injection 75 mL (75 mLs Intravenous Contrast Given 12/26/22 0648)  acetaminophen (TYLENOL) tablet 1,000 mg (1,000 mg Oral Given 12/26/22 0705)  oxyCODONE (Oxy IR/ROXICODONE) immediate release tablet 5 mg (5 mg Oral Given 12/26/22 0719)  heparin bolus via infusion 5,600 Units (5,600 Units Intravenous Bolus from Bag 12/26/22 0744)  potassium chloride SA (KLOR-CON M) CR tablet 40 mEq (40 mEq Oral Given 12/26/22 0804)  oxyCODONE (Oxy IR/ROXICODONE) immediate release tablet 5 mg (5 mg Oral Given 12/26/22 1624)    Mobility walks Low fall risk   Focused Assessments Cardiac Assessment Handoff:    No results found for: "CKTOTAL", "CKMB", "CKMBINDEX", "TROPONINI" No results found for: "DDIMER" Does the Patient currently have chest pain? No    R Recommendations: See Admitting Provider Note  Report given to:   Additional Notes:

## 2022-12-26 NOTE — Progress Notes (Signed)
ANTICOAGULATION CONSULT NOTE   Pharmacy Consult for Heparin Indication: pulmonary embolus  Allergies  Allergen Reactions   Tramadol Anaphylaxis   Codeine Nausea And Vomiting   Chlorhexidine Rash    Patient Measurements: Height: '5\' 3"'$  (160 cm) Weight: 78.9 kg (174 lb) IBW/kg (Calculated) : 52.4 Heparin Dosing Weight: 70 kg  Vital Signs: Temp: 98.2 F (36.8 C) (01/15 0535) Temp Source: Oral (01/15 0535) BP: 156/87 (01/15 0535) Pulse Rate: 115 (01/15 0535)  Labs: Recent Labs    12/26/22 0625 12/26/22 0626  HGB 14.5  --   HCT 44.9  --   PLT 319  --   CREATININE 0.73  --   TROPONINIHS  --  <2    Estimated Creatinine Clearance: 82.7 mL/min (by C-G formula based on SCr of 0.73 mg/dL).  Assessment: 79 YOF presenting with left lateral chest pain and productive cough with bloody sputum. Recent COVID 19 diagnosis. Found to have bilateral PE without evidence of heart strain. Not on anticoagulation PTA. Pharmacy consulted to dose heparin.  Clarified with Dr. Tamera Punt regarding bloody sputum - reported as minimal. Okay to continue with full bolus and treatment dosing.  Goal of Therapy:  Heparin level 0.3-0.7 units/ml Monitor platelets by anticoagulation protocol: Yes   Plan:  Heparin 5600 units IV once, then heparin 1200 units/hr Check 6hr heparin level at 1400 Daily heparin level and CBC Monitor for signs/symptoms of bleeding  Merrilee Jansky, PharmD Clinical Pharmacist 12/26/2022,7:22 AM

## 2022-12-26 NOTE — ED Provider Notes (Addendum)
[facility] EMERGENCY DEPARTMENT Provider Note   CSN: [id number] Arrival date & time: 12/26/22  0516     History  No chief complaint on file.   Bianca Gibson is a 52 y.o. female.  The history is provided by the patient.  Cough Cough characteristics:  Productive Sputum characteristics:  Bloody Severity:  Moderate Onset quality:  Gradual Timing:  Intermittent Progression:  Unchanged Chronicity:  New Context: upper respiratory infection   Context comment:  Is 9 days out from covid 19 diagnosis Relieved by:  Nothing Worsened by:  Nothing Ineffective treatments:  None tried Associated symptoms: chest pain   Associated symptoms: no fever   Associated symptoms comment:  Left lower CP, on estrogen  Patient with a history of GERD and trigeminal neuralgia who tested positive for Covid 19 9 days ago presents with cough productive of bloody sputum and left lower lateral chest pain.  No fevers.      Past Medical History:  Diagnosis Date   Anxiety    Arthritis    GERD (gastroesophageal reflux disease)    History of kidney stones    Obesity    Panic attack    Pneumonia    Tachycardia    Trigeminal neuralgia      Home Medications Prior to Admission medications   Medication Sig Start Date End Date Taking? Authorizing Provider  aspirin EC 325 MG tablet Take 1 tablet (325 mg total) by mouth 2 (two) times daily. 08/29/22   Leighton Parody, PA-C  busPIRone (BUSPAR) 10 MG tablet Take 10 mg by mouth daily as needed for anxiety. 03/18/22   [provider]  docusate sodium (COLACE) 100 MG capsule Take 1 capsule (100 mg total) by mouth 2 (two) times daily. 08/29/22 08/29/23  Leighton Parody, PA-C  estradiol (VIVELLE-DOT) 0.05 MG/24HR patch 1 patch 2 (two) times a week. 03/23/22   [provider]  furosemide (LASIX) 20 MG tablet Take 20 mg by mouth as needed for edema. weekly 07/27/22   [provider]  metoprolol tartrate (LOPRESSOR) 25 MG tablet Take  25 mg by mouth 2 (two) times daily.    [provider]  oxyCODONE-acetaminophen (PERCOCET/ROXICET) 5-325 MG tablet Take 1 tablet by mouth every 4 (four) hours as needed for severe pain. 08/29/22   Leighton Parody, PA-C  pregabalin (LYRICA) 150 MG capsule Take 150 mg by mouth 2 (two) times daily. 07/14/22   [provider]  tiZANidine (ZANAFLEX) 2 MG tablet Take 1 tablet (2 mg total) by mouth every 6 (six) hours as needed. 08/29/22   Leighton Parody, PA-C      Allergies    Tramadol, Codeine, and Chlorhexidine    Review of Systems   Review of Systems  Constitutional:  Negative for fever.  HENT:  Negative for facial swelling.   Respiratory:  Positive for cough.   Cardiovascular:  Positive for chest pain.  All other systems reviewed and are negative.   Physical Exam Updated Vital Signs BP (!) 156/87 (BP Location: Right Arm)   Pulse (!) 115   Temp 98.2 F (36.8 C) (Oral)   Resp 15   Ht '5\' 3"'$  (1.6 m)   Wt 78.9 kg   SpO2 98%   BMI 30.82 kg/m  Physical Exam Vitals and nursing note reviewed.  Constitutional:      General: She is not in acute distress.    Appearance: Normal appearance. She is well-developed.  HENT:     Head: Normocephalic and atraumatic.  Nose: Nose normal.  Eyes:     Pupils: Pupils are equal, round, and reactive to light.  Cardiovascular:     Rate and Rhythm: Regular rhythm. Tachycardia present.     Pulses: Normal pulses.     Heart sounds: Normal heart sounds.  Pulmonary:     Effort: Pulmonary effort is normal. No respiratory distress.     Breath sounds: Normal breath sounds. No rales.  Abdominal:     General: Bowel sounds are normal. There is no distension.     Palpations: Abdomen is soft.     Tenderness: There is no abdominal tenderness. There is no guarding or rebound.  Genitourinary:    Vagina: No vaginal discharge.  Musculoskeletal:        General: Normal range of motion.     Cervical back: Normal range of motion and neck supple.      Right lower leg: No edema.     Left lower leg: No edema.  Skin:    General: Skin is warm and dry.     Capillary Refill: Capillary refill takes less than 2 seconds.     Findings: No erythema or rash.  Neurological:     General: No focal deficit present.     Mental Status: She is alert and oriented to person, place, and time.     Deep Tendon Reflexes: Reflexes normal.  Psychiatric:        Mood and Affect: Mood normal.     ED Results / Procedures / Treatments   Labs (all labs ordered are listed, but only abnormal results are displayed) Results for orders placed or performed during the hospital encounter of 12/26/22  CBC with Differential  Result Value Ref Range   WBC 5.8 4.0 - 10.5 K/uL   RBC 4.95 3.87 - 5.11 MIL/uL   Hemoglobin 14.5 12.0 - 15.0 g/dL   HCT 44.9 36.0 - 46.0 %   MCV 90.7 80.0 - 100.0 fL   MCH 29.3 26.0 - 34.0 pg   MCHC 32.3 30.0 - 36.0 g/dL   RDW 13.8 11.5 - 15.5 %   Platelets 319 150 - 400 K/uL   nRBC 0.0 0.0 - 0.2 %   Neutrophils Relative % 71 %   Neutro Abs 4.1 1.7 - 7.7 K/uL   Lymphocytes Relative 20 %   Lymphs Abs 1.2 0.7 - 4.0 K/uL   Monocytes Relative 7 %   Monocytes Absolute 0.4 0.1 - 1.0 K/uL   Eosinophils Relative 1 %   Eosinophils Absolute 0.1 0.0 - 0.5 K/uL   Basophils Relative 0 %   Basophils Absolute 0.0 0.0 - 0.1 K/uL   Immature Granulocytes 1 %   Abs Immature Granulocytes 0.03 0.00 - 0.07 K/uL  Basic metabolic panel  Result Value Ref Range   Sodium 139 135 - 145 mmol/L   Potassium 3.4 (L) 3.5 - 5.1 mmol/L   Chloride 102 98 - 111 mmol/L   CO2 26 22 - 32 mmol/L   Glucose, Bld 113 (H) 70 - 99 mg/dL   BUN 9 6 - 20 mg/dL   Creatinine, Ser 0.73 0.44 - 1.00 mg/dL   Calcium 9.8 8.9 - 10.3 mg/dL   GFR, Estimated >60 >60 mL/min   Anion gap 11 5 - 15   No results found.  EKG EKG Interpretation  Date/Time:  Monday December 26 2022 05:45:20 EST Ventricular Rate:  106 PR Interval:  146 QRS Duration: 64 QT Interval:  293 QTC  Calculation: 389 R Axis:   11  Text Interpretation: Sinus tachycardia Low voltage, precordial leads LVH with secondary repolarization abnormality Confirmed by Randal Buba, Modestine Scherzinger ([id number]) on 12/26/2022 5:48:25 AM  Radiology No results found.  Procedures Procedures    Medications Ordered in ED Medications  sodium chloride 0.9 % bolus 500 mL (has no administration in time range)  acetaminophen (TYLENOL) tablet 1,000 mg (has no administration in time range)  iohexol (OMNIPAQUE) 350 MG/ML injection 75 mL (75 mLs Intravenous Contrast Given 12/26/22 0648)    ED Course/ Medical Decision Making/ A&P                             Medical Decision Making Patient with covid 19 x 9 days with left lower lateral chest pain and cough productive of bloody sputum   Amount and/or Complexity of Data Reviewed External Data Reviewed: notes.    Details: Previous notes reviewed  Labs: ordered.    Details: All labs reviewed: normal white count 4.4, normal hemoglobin 14.5, normal platelet count. Normal 139, low potassium 3.4, normal creatinine .14 Radiology: ordered and independent interpretation performed.    Details: No saddle embolism by me  ECG/medicine tests: ordered and independent interpretation performed. Decision-making details documented in ED Course.  Risk OTC drugs. Prescription drug management. Decision regarding hospitalization.  Critical Care Total time providing critical care: 30 minutes (Heparin drip )    Final Clinical Impression(s) / ED Diagnoses Final diagnoses:  [id number]   The patient appears reasonably stabilized for admission considering the current resources, flow, and capabilities available in the ED at this time, and I doubt any other [facility] requiring further screening and/or treatment in the ED prior to admission.    Chanell Nadeau, MD 12/26/22 [phone]

## 2022-12-26 NOTE — H&P (Addendum)
History and Physical      Bianca Gibson MHD:[id number] DOB: August 13, 1971 DOA: 12/26/2022  PCP: Benjamine Sprague, MD  Patient coming from: home   I have personally briefly reviewed patient's old medical records in [place]  Chief Complaint: right-sided chest pain  HPI: Bianca Gibson is a 52 y.o. female with medical history significant for generalized anxiety disorder left total knee arthroplasty in September 2020, who is admitted to [facility] on 12/26/2022 by way of transfer from [place] with acute bilateral pulmonary emboli  after presenting from home to the latter facility complaining of right-sided chest discomfort.   The patient reports 1 to 2 days of intermittent, sharp, nonradiating right-sided chest discomfort, that is nonexertional, worsens with deep inspiration or cough.  Nonpositional, and not reproducible with direct palpation over the anterior aspect of the right chest.  This has been associated with new onset mildly productive cough with some associated mild blood-tinged sputum, in the absence of overt hemoptysis or clot production associated with her cough.  Denies any associated left-sided chest discomfort nor any exertional component to this pain.  Denies any associated palpitations, diaphoresis, dizziness, presyncope, or syncope.  Not associate with any recent nausea, vomiting.  She notes recent preceding diagnosis of COVID-19 infection.    Notes no associated shortness of breath, wheezing, orthopnea, PND, worsening of peripheral edema, nor any new onset calf tenderness or new lower extremity erythema.  Denies any personal history of prior DVT or PE.  In terms of recent surgical procedures, she notes that she underwent left-sided TKA in September 2023, with subsequent completion of antiplatelet regimen in the form of full dose aspirin p.o. twice daily.  Subsequent to completion of this regimen, she is not currently on any blood thinners as an  outpatient, including no residual aspirin.  No recent trauma, travel.  She is not on any OCPs but does use an estrogen patch as an outpatient.  No known history of gastrointestinal bleed.  No recent periods of diminished ambulatory activity.  No known personal history of underlying malignancy.      [facility] ED Course:  Vital signs in the ED were notable for the following: Afebrile; initial heart rates low 100s to 1-teens, socially improving into the 80s following IV fluids, pain control, and resumption of her home metoprolol tartrate; systolic blood pressures in the 120s to 150s; respiratory rate 15-18, oxygen saturation 94 to 100% on room air.  Labs were notable for the following: BMP notable for sodium 139, potassium 3.4, bicarbonate 26, creatinine 0.73.  Serum magnesium level 2.0, phosphorus 2.0.  High-sensitivity troponin I less than 2.  CBC notable for white cell count 5800, hemoglobin 13.5.  Urinalysis ordered, with result currently pending.  Per my interpretation, EKG in ED demonstrated the following: EKG, in comparison to most recent prior EKG performed on 06/12/2021, shows sinus tachycardia with heart rate 106, normal intervals, nonspecific T wave inversion in in leads V1, V2, and V3, of which T wave version in V1 and V3 appear unchanged relative to most recent prior EKG, also demonstrating nonspecific less than 1 mm ST depression in leads V2 and V3, which appears unchanged from most recent prior EKG, while showing no evidence of ST elevation.  Imaging and additional notable ED work-up: CTA chest performed radiology read, showed acute bilateral pulmonary emboli, left greater than right involving the lower lobes, without evidence of saddle embolus nor evidence of right heart strain; this imaging also showed  probable left lower lobe lateral basal segment pulmonary infarct as well as small left pleural effusion, will demonstrated no evidence of infiltrate, edema, or  pneumothorax.  While in the ED, the following were administered: Acetaminophen 1 g p.o. x 1, heparin bolus followed by heparin drip, Lopressor 25 mg p.o. x 1 dose, oxycodone 5 mg p.o. x 3 doses, K-Phos Neutral 500 mg p.o. x 2 doses, potassium chloride 40 mill equivalents p.o. x 1 dose, normal saline x 500 cc bolus.  Subsequently, the patient was admitted to [facility] for further evaluation and management presenting acute bilateral pulmonary emboli, in the setting of presenting atypical right-sided chest discomfort, with presenting labs also notable for mild hypophosphatemia as well as mild hypokalemia.    Review of Systems: As per HPI otherwise 10 point review of systems negative.   Past Medical History:  Diagnosis Date   Anxiety    Arthritis    GERD (gastroesophageal reflux disease)    History of kidney stones    Obesity    Panic attack    Pneumonia    Tachycardia    Trigeminal neuralgia     Past Surgical History:  Procedure Laterality Date   ABDOMINAL HYSTERECTOMY     BACK SURGERY     CHOLECYSTECTOMY     EXPLORATORY LAPAROTOMY     HERNIA REPAIR     KIDNEY STONE SURGERY     KNEE ARTHROSCOPY Left    TOTAL KNEE ARTHROPLASTY Left 08/29/2022   Procedure: TOTAL KNEE ARTHROPLASTY;  Surgeon: Dorna Leitz, MD;  Location: [facility] ORS;  Service: Orthopedics;  Laterality: Left;    Social History:  reports that she has never smoked. She has never used smokeless tobacco. She reports current alcohol use. She reports that she does not currently use drugs.   Allergies  Allergen Reactions   Tramadol Anaphylaxis   Codeine Nausea And Vomiting   Chlorhexidine Rash    History reviewed. No pertinent family history.  Family history reviewed and not pertinent    Prior to Admission medications   Medication Sig Start Date End Date Taking? Authorizing Provider  aspirin EC 325 MG tablet Take 1 tablet (325 mg total) by mouth 2 (two) times daily. 08/29/22   Leighton Parody, PA-C  busPIRone  (BUSPAR) 10 MG tablet Take 10 mg by mouth daily as needed for anxiety. 03/18/22   [provider]  docusate sodium (COLACE) 100 MG capsule Take 1 capsule (100 mg total) by mouth 2 (two) times daily. 08/29/22 08/29/23  Leighton Parody, PA-C  estradiol (VIVELLE-DOT) 0.05 MG/24HR patch 1 patch 2 (two) times a week. 03/23/22   [provider]  furosemide (LASIX) 20 MG tablet Take 20 mg by mouth as needed for edema. weekly 07/27/22   [provider]  metoprolol tartrate (LOPRESSOR) 25 MG tablet Take 25 mg by mouth 2 (two) times daily.    [provider]  oxyCODONE-acetaminophen (PERCOCET/ROXICET) 5-325 MG tablet Take 1 tablet by mouth every 4 (four) hours as needed for severe pain. 08/29/22   Leighton Parody, PA-C  pregabalin (LYRICA) 150 MG capsule Take 150 mg by mouth 2 (two) times daily. 07/14/22   [provider]  tiZANidine (ZANAFLEX) 2 MG tablet Take 1 tablet (2 mg total) by mouth every 6 (six) hours as needed. 08/29/22   Leighton Parody, PA-C     Objective    Physical Exam: Vitals:   12/26/22 1511 12/26/22 1700 12/26/22 1800 12/26/22 1942  BP:  (!) 149/99 (!) 143/93 Marland Kitchen)  169/86  Pulse:  99 85 86  Resp:  '13 13 17  '$ Temp: 98.4 F (36.9 C)   98.4 F (36.9 C)  TempSrc: Oral   Oral  SpO2:  95% 96% 100%  Weight:      Height:        General: appears to be stated age; alert, oriented Skin: warm, dry, no rash Head:  AT/[place] Mouth:  Oral mucosa membranes appear moist, normal dentition Neck: supple; trachea midline Heart:  RRR; did not appreciate any M/R/G Lungs: CTAB, did not appreciate any wheezes, rales, or rhonchi Abdomen: + BS; soft, ND, NT Vascular: 2+ pedal pulses b/l; 2+ radial pulses b/l Extremities: no peripheral edema, no muscle wasting Neuro: strength and sensation intact in upper and lower extremities b/l     Labs on Admission: I have personally reviewed following labs and imaging studies  CBC: Recent Labs  Lab 12/26/22 0625  WBC  5.8  NEUTROABS 4.1  HGB 14.5  HCT 44.9  MCV 90.7  PLT 144   Basic Metabolic Panel: Recent Labs  Lab 12/26/22 0625  NA 139  K 3.4*  CL 102  CO2 26  GLUCOSE 113*  BUN 9  CREATININE 0.73  CALCIUM 9.8  MG 2.0  PHOS 2.0*   GFR: Estimated Creatinine Clearance: 82.7 mL/min (by C-G formula based on SCr of 0.73 mg/dL). Liver Function Tests: No results for input(s): "AST", "ALT", "ALKPHOS", "BILITOT", "PROT", "ALBUMIN" in the last 168 hours. No results for input(s): "LIPASE", "AMYLASE" in the last 168 hours. No results for input(s): "AMMONIA" in the last 168 hours. Coagulation Profile: No results for input(s): "INR", "PROTIME" in the last 168 hours. Cardiac Enzymes: No results for input(s): "CKTOTAL", "CKMB", "CKMBINDEX", "TROPONINI" in the last 168 hours. BNP (last 3 results) No results for input(s): "PROBNP" in the last 8760 hours. HbA1C: No results for input(s): "HGBA1C" in the last 72 hours. CBG: No results for input(s): "GLUCAP" in the last 168 hours. Lipid Profile: No results for input(s): "CHOL", "HDL", "LDLCALC", "TRIG", "CHOLHDL", "LDLDIRECT" in the last 72 hours. Thyroid Function Tests: No results for input(s): "TSH", "T4TOTAL", "FREET4", "T3FREE", "THYROIDAB" in the last 72 hours. Anemia Panel: No results for input(s): "VITAMINB12", "FOLATE", "FERRITIN", "TIBC", "IRON", "RETICCTPCT" in the last 72 hours. Urine analysis:    Component Value Date/Time   COLORURINE YELLOW 12/01/2020 0555   APPEARANCEUR HAZY (A) 12/01/2020 0555   LABSPEC >1.030 (H) 12/01/2020 0555   PHURINE 5.5 12/01/2020 0555   GLUCOSEU 100 (A) 12/01/2020 0555   HGBUR TRACE (A) 12/01/2020 0555   BILIRUBINUR SMALL (A) 12/01/2020 0555   KETONESUR 15 (A) 12/01/2020 0555   PROTEINUR NEGATIVE 12/01/2020 0555   UROBILINOGEN 0.2 12/06/2014 1315   NITRITE NEGATIVE 12/01/2020 0555   LEUKOCYTESUR NEGATIVE 12/01/2020 0555    Radiological Exams on Admission: CT Angio Chest PE W and/or Wo  Contrast  Addendum Date: 12/26/2022   ADDENDUM REPORT: 12/26/2022 07:12 ADDENDUM: Critical Value/emergent results were called by telephone at the time of interpretation on 12/26/2022 at 0707 hours to Dr. Veatrice Kells , who verbally acknowledged these results. She advises the patient is also COVID-19 positive. Electronically Signed   By: Genevie Ann M.D.   On: 12/26/2022 07:12   Result Date: 12/26/2022 CLINICAL DATA:  52 year old female with shortness of breath, cough, left chest pain. EXAM: CT ANGIOGRAPHY CHEST WITH CONTRAST TECHNIQUE: Multidetector CT imaging of the chest was performed using the standard protocol during bolus administration of intravenous contrast. Multiplanar CT image reconstructions and MIPs were obtained  to evaluate the vascular anatomy. RADIATION DOSE REDUCTION: This exam was performed according to the departmental dose-optimization program which includes automated exposure control, adjustment of the mA and/or kV according to patient size and/or use of iterative reconstruction technique. CONTRAST:  59m OMNIPAQUE IOHEXOL 350 MG/ML SOLN COMPARISON:  Chest CTA 01/03/2015. FINDINGS: Cardiovascular: Adequate contrast bolus timing in the pulmonary arterial tree. Left lower lobar pulmonary embolus with filling defect at the branching of the left lower lobe segmental arteries on series 6, image 142. Clot continues distally especially in the medial basal and lateral basal segments with associated downstream airspace disease, see below. No saddle embolus. Small volume contralateral right lower lobe thrombus which appears mostly nonocclusive (series 6, image 123). RV LV ratio 0.77, within normal limits. No cardiomegaly or pericardial effusion. Negative visible aorta. No calcified coronary artery plaque is evident. Mediastinum/Nodes: No mediastinal mass or lymphadenopathy. Lungs/Pleura: Platelike and wedge-shaped bilateral lower lobe lung opacity, although mostly enhancing. However, there is less  enhancement of lateral basal segment left lower lobe confluent opacity which is suspicious for pulmonary infarct (series 4, image 48) and superimposed small layering left pleural effusion with simple fluid density. Major airways remain patent. Superimposed bilateral linear scarring and atelectasis in both middle lobes and along the right minor fissure. No right pleural effusion. Upper Abdomen: Negative visible liver, spleen, pancreas, adrenal glands, bowel and left kidney. Musculoskeletal: No acute or suspicious osseous lesion identified. Incidental bilateral breast implants. Review of the MIP images confirms the above findings. IMPRESSION: 1. Positive for acute left > right lower lobe pulmonary emboli. No saddle embolus or CTA evidence of right heart strain. 2. Probable left lower lobe lateral basal segment pulmonary infarct with small layering left pleural effusion. Superimposed additional bilateral lower lobe and lung base atelectasis, scarring. Electronically Signed: By: HGenevie AnnM.D. On: 12/26/2022 07:06      Assessment/Plan    Principal Problem:   Acute pulmonary embolism (HCC) Active Problems:   Atypical chest pain   Hypophosphatemia   Hypokalemia   GAD (generalized anxiety disorder)     #) Acute bilateral pulmonary emboli: In the context of presenting pleuritic right-sided chest pain associated with cough involving very mild blood-tinged sputum, CTA chest showed evidence of acute bilateral pulmonary emboli, without evidence of saddle embolus, with associated evidence of left lower lobe lateral basal segment pulmonary infarct, as further detailed above.  No clinical or radiographic evidence to suggest cor pulmonale, however will pursue echocardiogram to further evaluate.  Additionally, no evidence of associated hypotension to warrant consideration for tPA administration at this time.  Additionally, no evidence of acute hypoxia.  Appears to be the patient's first PE/DVT.  Appears to have  potentially multiple provoking features, including recent diagnosis of COVID-19 with its associated per inflammatory state, in addition to use of estrogen patches as outpatient, as well as as recent surgical procedures in the form of left total knee arthroplasty performed during the fourth 1:45 thousand and 23, as further quantified above.  Otherwise, no overt provoking factors identified at this time.  Overall, will require at least 3-6 months of anticoagulation. Not on any blood-thinners at home, including no aspirin. For now , [facility]  There is limited utility in obtaining venous Dopplers of the bilateral lower extremities as half of all pulmonary emboli occur in the absence of overtly identifiable DVTs.     Plan: Heparin drip overnight, associated with pharmacy consultation for assistance with dosing/modifications thereof. Monitor  on telemetry. Monitor for development of hypotension. Prn supplemental O2 in order to maintain oxygen saturations greater than or equal to 92%.  Continue home prn Percocet.  Incentive spirometry ordered.  Repeat CBC in the morning.  Echocardiogram, as above.            #) Atypical right-sided chest pain: Pleuritic right-sided chest discomfort that appears consistent with her new diagnosis of acute bilateral pulmonary emboli, as above.  Pain has been nonexertional.  It appears less suggestive of ACS given the atypical nature of the chest discomfort, nonelevated troponin level, and EKG showing no evidence of acute ischemic changes, including no evidence of ST elevation.  Will pursue further evaluation management of presenting acute bilateral pulmonary emboli, including heparin drip overnight, will provide symptomatic management as further noted below.   Plan: Further evaluation management worsening acute bilateral pulmonary emboli, as above.  Heparin drip.  Resume home prn Percocet.  Repeat CMP,  CBC.            #) Hypophosphatemia: Presenting serum phosphorus level mildly low, as quantified above.  She has already received 2 doses of K-Phos and [place] earlier today, and is due for third dose this evening.  Plan: Continue current scheduled dose of K-Phos this evening.  Repeat serum phosphorus level has been ordered for the morning.             #) Hypokalemia: Presenting serum potassium level found to be mildly low, quantified above.  She is status post 2 doses of K-Phos administered at [facility] earlier today, and is due for an additional dose of K-Phos this evening.  Additionally, she received potassium chloride 40 mill colons p.o. x 1 dose at [facility] earlier today.  3 magnesium level noted to be within normal limits at 2.0.  Plan: Continue final scheduled dose of K-Phos, as above.  Repeat CMP in the morning as well as serum magnesium level at that time.             #) Generalized anxiety disorder: documented h/o such. On prn BuSpar as outpatient.    Plan: Continue home prn BuSpar.         DVT prophylaxis: Heparin drip, as above Code Status: Full code Family Communication: none Disposition Plan: Per Rounding Team Consults called: none;  Admission status: obs    I SPENT GREATER THAN 75  MINUTES IN CLINICAL CARE TIME/MEDICAL DECISION-MAKING IN COMPLETING THIS ADMISSION.     [place] DO [facility] [place]   12/26/2022, 8:15 PM

## 2022-12-26 NOTE — Progress Notes (Signed)
ANTICOAGULATION CONSULT NOTE  Pharmacy Consult for Heparin Indication: pulmonary embolus  Allergies  Allergen Reactions   Tramadol Anaphylaxis   Codeine Nausea And Vomiting   Chlorhexidine Rash    Patient Measurements: Height: '5\' 3"'$  (160 cm) Weight: 78.9 kg (174 lb) IBW/kg (Calculated) : 52.4 Heparin Dosing Weight: 70 kg  Vital Signs: Temp: 98.4 F (36.9 C) (01/15 1511) Temp Source: Oral (01/15 1511) BP: 149/99 (01/15 1700) Pulse Rate: 99 (01/15 1700)  Labs: Recent Labs    12/26/22 0625 12/26/22 0626 12/26/22 1522  HGB 14.5  --   --   HCT 44.9  --   --   PLT 319  --   --   HEPARINUNFRC  --   --  0.53  CREATININE 0.73  --   --   TROPONINIHS  --  <2  --      Estimated Creatinine Clearance: 82.7 mL/min (by C-G formula based on SCr of 0.73 mg/dL).  Assessment: 23 YOF presenting with left lateral chest pain and productive cough with bloody sputum. Recent COVID 19 diagnosis. Found to have bilateral PE without evidence of heart strain. Not on anticoagulation PTA. Pharmacy consulted to dose heparin.  Initial heparin level is therapeutic at 0.53 units/hr.  Bloody sputum is not worse; just more brown per RN.  Goal of Therapy:  Heparin level 0.3-0.7 units/ml Monitor platelets by anticoagulation protocol: Yes   Plan:  Continue heparin infusion at 1200 units/hr Daily heparin level and CBC Monitor for signs/symptoms of bleeding  Liddy Deam D. Mina Marble, PharmD, BCPS, [place] 12/26/2022, 5:06 PM

## 2022-12-26 NOTE — ED Triage Notes (Signed)
Pt reports right sided chest pain X2 days.  Pt denies any injury and states the sharp pain goes from under the arm, up the right side of the chest and to the right shoulder.

## 2022-12-27 ENCOUNTER — Observation Stay (HOSPITAL_BASED_OUTPATIENT_CLINIC_OR_DEPARTMENT_OTHER): Payer: 59

## 2022-12-27 ENCOUNTER — Other Ambulatory Visit (HOSPITAL_COMMUNITY): Payer: Self-pay

## 2022-12-27 ENCOUNTER — Observation Stay (HOSPITAL_COMMUNITY): Payer: 59

## 2022-12-27 DIAGNOSIS — I2699 Other pulmonary embolism without acute cor pulmonale: Secondary | ICD-10-CM | POA: Diagnosis present

## 2022-12-27 DIAGNOSIS — Z7982 Long term (current) use of aspirin: Secondary | ICD-10-CM | POA: Diagnosis not present

## 2022-12-27 DIAGNOSIS — Z888 Allergy status to other drugs, medicaments and biological substances status: Secondary | ICD-10-CM | POA: Diagnosis not present

## 2022-12-27 DIAGNOSIS — Z8616 Personal history of COVID-19: Secondary | ICD-10-CM | POA: Diagnosis not present

## 2022-12-27 DIAGNOSIS — I2609 Other pulmonary embolism with acute cor pulmonale: Secondary | ICD-10-CM

## 2022-12-27 DIAGNOSIS — I82452 Acute embolism and thrombosis of left peroneal vein: Secondary | ICD-10-CM | POA: Diagnosis present

## 2022-12-27 DIAGNOSIS — K219 Gastro-esophageal reflux disease without esophagitis: Secondary | ICD-10-CM | POA: Diagnosis present

## 2022-12-27 DIAGNOSIS — Z79890 Hormone replacement therapy (postmenopausal): Secondary | ICD-10-CM | POA: Diagnosis not present

## 2022-12-27 DIAGNOSIS — I10 Essential (primary) hypertension: Secondary | ICD-10-CM | POA: Diagnosis present

## 2022-12-27 DIAGNOSIS — Z96652 Presence of left artificial knee joint: Secondary | ICD-10-CM | POA: Diagnosis present

## 2022-12-27 DIAGNOSIS — Z886 Allergy status to analgesic agent status: Secondary | ICD-10-CM | POA: Diagnosis not present

## 2022-12-27 DIAGNOSIS — Z6830 Body mass index (BMI) 30.0-30.9, adult: Secondary | ICD-10-CM | POA: Diagnosis not present

## 2022-12-27 DIAGNOSIS — F411 Generalized anxiety disorder: Secondary | ICD-10-CM | POA: Diagnosis present

## 2022-12-27 DIAGNOSIS — Z79818 Long term (current) use of other agents affecting estrogen receptors and estrogen levels: Secondary | ICD-10-CM | POA: Diagnosis not present

## 2022-12-27 DIAGNOSIS — E669 Obesity, unspecified: Secondary | ICD-10-CM | POA: Diagnosis present

## 2022-12-27 DIAGNOSIS — E876 Hypokalemia: Secondary | ICD-10-CM | POA: Diagnosis present

## 2022-12-27 DIAGNOSIS — G8929 Other chronic pain: Secondary | ICD-10-CM | POA: Diagnosis present

## 2022-12-27 DIAGNOSIS — Z79899 Other long term (current) drug therapy: Secondary | ICD-10-CM | POA: Diagnosis not present

## 2022-12-27 LAB — HEPARIN LEVEL (UNFRACTIONATED): Heparin Unfractionated: 0.37 IU/mL (ref 0.30–0.70)

## 2022-12-27 LAB — CBC
HCT: 39.6 % (ref 36.0–46.0)
Hemoglobin: 12 g/dL (ref 12.0–15.0)
MCH: 28.7 pg (ref 26.0–34.0)
MCHC: 30.3 g/dL (ref 30.0–36.0)
MCV: 94.7 fL (ref 80.0–100.0)
Platelets: 295 10*3/uL (ref 150–400)
RBC: 4.18 MIL/uL (ref 3.87–5.11)
RDW: 13.7 % (ref 11.5–15.5)
WBC: 3.9 10*3/uL — ABNORMAL LOW (ref 4.0–10.5)
nRBC: 0 % (ref 0.0–0.2)

## 2022-12-27 LAB — COMPREHENSIVE METABOLIC PANEL
ALT: 20 U/L (ref 0–44)
AST: 25 U/L (ref 15–41)
Albumin: 3 g/dL — ABNORMAL LOW (ref 3.5–5.0)
Alkaline Phosphatase: 69 U/L (ref 38–126)
Anion gap: 10 (ref 5–15)
BUN: 8 mg/dL (ref 6–20)
CO2: 24 mmol/L (ref 22–32)
Calcium: 8.8 mg/dL — ABNORMAL LOW (ref 8.9–10.3)
Chloride: 108 mmol/L (ref 98–111)
Creatinine, Ser: 0.68 mg/dL (ref 0.44–1.00)
GFR, Estimated: >60 mL/min (ref >60)
Glucose, Bld: 92 mg/dL (ref 70–99)
Potassium: 3.3 mmol/L — ABNORMAL LOW (ref 3.5–5.1)
Sodium: 142 mmol/L (ref 135–145)
Total Bilirubin: 0.6 mg/dL (ref 0.3–1.2)
Total Protein: 5.8 g/dL — ABNORMAL LOW (ref 6.5–8.1)

## 2022-12-27 LAB — MAGNESIUM: Magnesium: 1.9 mg/dL (ref 1.7–2.4)

## 2022-12-27 LAB — PHOSPHORUS: Phosphorus: 4.4 mg/dL (ref 2.5–4.6)

## 2022-12-27 LAB — ECHOCARDIOGRAM COMPLETE
Area-P 1/2: 4.6 cm2
Height: 63 in
S' Lateral: 2.5 cm
Weight: 2784 oz

## 2022-12-27 NOTE — Progress Notes (Signed)
  Transition of Care [facility]) Screening Note   Patient Details  Name: MALAIA BUCHTA Date of Birth: 04/01/1971   [facility] of [facility]) CM/SW Contact:    Vassie Moselle, LCSW Phone Number: 12/27/2022, 11:19 AM    Transition of Care Department [facility]) has reviewed patient and no TOC needs have been identified at this time. We will continue to monitor patient advancement through interdisciplinary progression rounds. If new patient transition needs arise, please place a TOC consult.

## 2022-12-27 NOTE — Progress Notes (Signed)
Bilateral lower extremity venous duplex has been completed. Preliminary results can be found in CV Proc through chart review.  Results were given to Dr. Posey Pronto.  12/27/22 9:44 AM Bianca Gibson RVT

## 2022-12-27 NOTE — Progress Notes (Signed)
ANTICOAGULATION CONSULT NOTE  Pharmacy Consult for Heparin Indication: pulmonary embolus  Allergies  Allergen Reactions   Tramadol Anaphylaxis   Codeine Nausea And Vomiting   Chlorhexidine Rash    Patient Measurements: Height: '5\' 3"'$  (160 cm) Weight: 78.9 kg (174 lb) IBW/kg (Calculated) : 52.4 Heparin Dosing Weight: 70 kg  Vital Signs: Temp: 97.7 F (36.5 C) (01/16 0509) Temp Source: Oral (01/16 0509) BP: 119/79 (01/16 0509) Pulse Rate: 77 (01/16 0509)  Labs: Recent Labs    12/26/22 0625 12/26/22 0626 12/26/22 1522 12/27/22 0525  HGB 14.5  --   --  12.0  HCT 44.9  --   --  39.6  PLT 319  --   --  295  HEPARINUNFRC  --   --  0.53 0.37  CREATININE 0.73  --   --  0.68  TROPONINIHS  --  <2  --   --      Estimated Creatinine Clearance: 82.7 mL/min (by C-G formula based on SCr of 0.68 mg/dL).  Assessment: Bianca Gibson presenting with left lateral chest pain and productive cough with bloody sputum. Recent COVID 19 diagnosis. Found to have bilateral PE without evidence of heart strain. Not on anticoagulation PTA. Pharmacy consulted to dose heparin.  -Heparin level 0.37 - therapeutic on heparin infusion at 1200 units/hr -CBC with slight drop, but stable -No bleeding or infusion issues noted  Goal of Therapy:  Heparin level 0.3-0.7 units/ml Monitor platelets by anticoagulation protocol: Yes   Plan:  -Continue heparin infusion at 1200 units/hr -Daily heparin level and CBC -Monitor for signs/symptoms of bleeding -Follow up transition to oral anticoagulation as indicated  Tawnya Crook, PharmD, BCPS Clinical Pharmacist 12/27/2022 8:05 AM

## 2022-12-27 NOTE — Hospital Course (Signed)
PMH of anxiety, recent left knee arthroplasty in 08/2022, hemarthrosis recent COVID 12/17/18/2024, present to the hospital with hemoptysis, chest pain and shortness of breath as well as fatigue.  Found to have bilateral PE with DVT.  Currently on IV heparin therapy

## 2022-12-27 NOTE — Progress Notes (Signed)
  Echocardiogram 2D Echocardiogram has been performed.  Darlina Sicilian M 12/27/2022, 8:09 AM

## 2022-12-27 NOTE — Progress Notes (Signed)
Mobility Specialist - Progress Note   12/27/22 1107  Mobility  Activity Ambulated with assistance in hallway  Level of Assistance Independent after set-up  Assistive Device None  Distance Ambulated (ft) 500 ft  Activity Response Tolerated well  Mobility Referral Yes  $Mobility charge 1 Mobility   Pt received in bed and agreed to mobility. Had no c/o pain nor discomfort during session. Pt returned to bed with all needs met with family in room.  Roderick Pee Mobility Specialist

## 2022-12-27 NOTE — Progress Notes (Signed)
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  Found to have bilateral PE with DVT.  Currently on IV heparin therapy Assessment and Plan: Acute bilateral PE. Left VTE. Patient had recent knee surgery in September.  She required 2 arthrocentesis after that for hemarthrosis last 1 was in October end.  She recently had COVID on 12/17/2022 and now presents with complaints of hemoptysis chest pain and shortness of breath. CT PE protocol performed in the ER is positive for bilateral pulmonary embolism left more than right. VTE positive in left peroneal vein. Patient also takes estrogen on a regular basis. This this is mostly a provoked event. Will continue with IV heparin for now given her history of hemoptysis as well as hemarthrosis. As long as she does not have any significant swelling of her left knee or worsening of her hemoptysis patient will be able to convert to oral Eliquis in 24 hours. Currently not hypoxic not hypotensive and tachycardic.  Recent knee surgery. If there is any swelling she may require consultation with orthopedics.  Chronic pain. Patient is on Lyrica, Percocet will continue the same.  Anxiety. Patient is on BuSpar. Continue.  HTN. On metoprolol.  Continue.  Hypokalemia. Replaced.  Obesity Body mass index is 30.82 kg/m.  Placing the pt at higher risk of poor outcomes.   Subjective: Continues to have some chest pain.  Some cough.  No nausea or vomiting.  Has some shortness of breath.  No dizziness or lightheadedness on ambulation.  Physical Exam: General: in Mild distress, No Rash Cardiovascular: S1 and S2 Present, No Murmur Respiratory:  Good respiratory effort, Bilateral Air entry present.  Faint basal crackles, No wheezes Abdomen: Bowel Sound present, No tenderness Extremities: Trace left leg edema Neuro: Alert and oriented x3, no new focal deficit  Data Reviewed: I have Reviewed nursing notes, Vitals, and Lab results. Since last encounter, pertinent lab results CBC and BMP   . I have ordered test including CBC and BMP  .   Disposition: Status is: Inpatient Remains inpatient appropriate because: Need for IV therapy.   Family Communication: Husband at bedside Level of care: Telemetry Continue telemetry due to PE. Vitals:   12/26/22 2203 12/26/22 2333 12/27/22 0509 12/27/22 1401  BP: (!) 143/88 132/82 119/79 (!) 146/85  Pulse: 81 92 77 80  Resp:  '16 16 20  '$ Temp:  97.6 F (36.4 C) 97.7 F (36.5 C) 98 F (36.7 C)  TempSrc:  Oral Oral Oral  SpO2: 95% 95% 97% 98%  Weight:      Height:         Author: Berle Mull, MD 12/27/2022 8:37 PM  Please look on [email or web address] to find out who is on call.

## 2022-12-27 NOTE — TOC Benefit Eligibility Note (Signed)
Patient Teacher, English as a foreign language completed.    The patient is currently admitted and upon discharge could be taking Eliquis 5 mg.  The current 30 day co-pay is $110.00.   The patient is currently admitted and upon discharge could be taking Xarelto 20 mg.  The current 30 day co-pay is $110.00.   The patient is insured through [place] [facility] [place] Patient Advocate Team Direct Number: [phone]  Fax: [phone]

## 2022-12-28 DIAGNOSIS — E876 Hypokalemia: Secondary | ICD-10-CM | POA: Diagnosis not present

## 2022-12-28 DIAGNOSIS — F411 Generalized anxiety disorder: Secondary | ICD-10-CM | POA: Diagnosis not present

## 2022-12-28 DIAGNOSIS — I2699 Other pulmonary embolism without acute cor pulmonale: Secondary | ICD-10-CM | POA: Diagnosis not present

## 2022-12-28 LAB — CBC
HCT: 40.8 % (ref 36.0–46.0)
Hemoglobin: 12.7 g/dL (ref 12.0–15.0)
MCH: 29 pg (ref 26.0–34.0)
MCHC: 31.1 g/dL (ref 30.0–36.0)
MCV: 93.2 fL (ref 80.0–100.0)
Platelets: 310 10*3/uL (ref 150–400)
RBC: 4.38 MIL/uL (ref 3.87–5.11)
RDW: 13.6 % (ref 11.5–15.5)
WBC: 4.3 10*3/uL (ref 4.0–10.5)
nRBC: 0 % (ref 0.0–0.2)

## 2022-12-28 LAB — BASIC METABOLIC PANEL
Anion gap: 10 (ref 5–15)
BUN: 12 mg/dL (ref 6–20)
CO2: 22 mmol/L (ref 22–32)
Calcium: 9.1 mg/dL (ref 8.9–10.3)
Chloride: 109 mmol/L (ref 98–111)
Creatinine, Ser: 0.72 mg/dL (ref 0.44–1.00)
GFR, Estimated: >60 mL/min (ref >60)
Glucose, Bld: 96 mg/dL (ref 70–99)
Potassium: 3.4 mmol/L — ABNORMAL LOW (ref 3.5–5.1)
Sodium: 141 mmol/L (ref 135–145)

## 2022-12-28 LAB — HEPARIN LEVEL (UNFRACTIONATED): Heparin Unfractionated: 0.22 IU/mL — ABNORMAL LOW (ref 0.30–0.70)

## 2022-12-28 MED ORDER — POTASSIUM CHLORIDE CRYS ER 20 MEQ PO TBCR
40.0000 meq | EXTENDED_RELEASE_TABLET | Freq: Once | ORAL | Status: AC
  Administered 2022-12-28: 40 meq via ORAL
  Filled 2022-12-28: qty 2

## 2022-12-28 MED ORDER — APIXABAN 5 MG PO TABS
10.0000 mg | ORAL_TABLET | Freq: Two times a day (BID) | ORAL | Status: DC
  Administered 2022-12-28: 10 mg via ORAL
  Filled 2022-12-28: qty 2

## 2022-12-28 MED ORDER — APIXABAN 5 MG PO TABS: 5.0000 mg | ORAL_TABLET | Freq: Two times a day (BID) | ORAL | Status: DC

## 2022-12-28 MED ORDER — APIXABAN 5 MG PO TABS: 10.0000 mg | ORAL_TABLET | Freq: Two times a day (BID) | ORAL | 0 refills | Status: AC

## 2022-12-28 MED ORDER — HEPARIN (PORCINE) 25000 UT/250ML-% IV SOLN: 1300.0000 [IU]/h | INTRAVENOUS | Status: DC

## 2022-12-28 MED ORDER — OXYCODONE HCL 5 MG PO TABS: 5.0000 mg | ORAL_TABLET | Freq: Three times a day (TID) | ORAL | 0 refills | Status: AC | PRN

## 2022-12-28 MED ORDER — APIXABAN 5 MG PO TABS: 5.0000 mg | ORAL_TABLET | Freq: Two times a day (BID) | ORAL | 2 refills | Status: AC

## 2022-12-28 NOTE — Discharge Instructions (Addendum)
  Take Eliquis for total duration of 3 months   Information on my medicine - ELIQUIS (apixaban)  This medication education was reviewed with me or my healthcare representative as part of my discharge preparation.    Why was Eliquis prescribed for you? Eliquis was prescribed to treat blood clots that may have been found in the veins of your legs (deep vein thrombosis) or in your lungs (pulmonary embolism) and to reduce the risk of them occurring again.  What do You need to know about Eliquis ? The starting dose is 10 mg (two 5 mg tablets) taken TWICE daily for the FIRST 5 DAYS, then on 01/02/2023  the dose is reduced to ONE 5 mg tablet taken TWICE daily.  Eliquis may be taken with or without food.   Try to take the dose about the same time in the morning and in the evening. If you have difficulty swallowing the tablet whole please discuss with your pharmacist how to take the medication safely.  Take Eliquis exactly as prescribed and DO NOT stop taking Eliquis without talking to the doctor who prescribed the medication.  Stopping may increase your risk of developing a new blood clot.  Refill your prescription before you run out.  After discharge, you should have regular check-up appointments with your healthcare provider that is prescribing your Eliquis.    What do you do if you miss a dose? If a dose of ELIQUIS is not taken at the scheduled time, take it as soon as possible on the same day and twice-daily administration should be resumed. The dose should not be doubled to make up for a missed dose.  Important Safety Information A possible side effect of Eliquis is bleeding. You should call your healthcare provider right away if you experience any of the following: Bleeding from an injury or your nose that does not stop. Unusual colored urine (red or dark brown) or unusual colored stools (red or black). Unusual bruising for unknown reasons. A serious fall or if you hit your head  (even if there is no bleeding).  Some medicines may interact with Eliquis and might increase your risk of bleeding or clotting while on Eliquis. To help avoid this, consult your healthcare provider or pharmacist prior to using any new prescription or non-prescription medications, including herbals, vitamins, non-steroidal anti-inflammatory drugs (NSAIDs) and supplements.  This website has more information on Eliquis (apixaban): http://www.eliquis.com/eliquis/home

## 2022-12-28 NOTE — Progress Notes (Addendum)
ANTICOAGULATION CONSULT NOTE  Pharmacy Consult for Heparin Indication: pulmonary embolus  Allergies  Allergen Reactions   Tramadol Anaphylaxis and Rash   Codeine Nausea And Vomiting   Chlorhexidine Rash    Patient Measurements: Height: '5\' 3"'$  (160 cm) Weight: 78.9 kg (174 lb) IBW/kg (Calculated) : 52.4 Heparin Dosing Weight: 70 kg  Vital Signs: Temp: 97.5 F (36.4 C) (01/17 0601) Temp Source: Oral (01/17 0601) BP: 153/95 (01/17 0601) Pulse Rate: 79 (01/17 0601)  Labs: Recent Labs    12/26/22 0625 12/26/22 0626 12/26/22 1522 12/27/22 0525 12/28/22 0557  HGB 14.5  --   --  12.0 12.7  HCT 44.9  --   --  39.6 40.8  PLT 319  --   --  295 310  HEPARINUNFRC  --   --  0.53 0.37 0.22*  CREATININE 0.73  --   --  0.68 0.72  TROPONINIHS  --  <2  --   --   --      Estimated Creatinine Clearance: 82.7 mL/min (by C-G formula based on SCr of 0.72 mg/dL).  Assessment: 41 YOF presenting with left lateral chest pain and productive cough with bloody sputum. Recent COVID 19 diagnosis. Found to have bilateral PE without evidence of heart strain. Not on anticoagulation PTA. Pharmacy consulted to dose heparin.  -Heparin level 0.22 - subtherapeutic on heparin infusion at 1200 units/hr -CBC stable -No bleeding or infusion issues/disruption per nurse  Goal of Therapy:  Heparin level 0.3-0.7 units/ml Monitor platelets by anticoagulation protocol: Yes   Plan:  -Increase heparin infusion to 1300 units/hr then 6 hour heparin level -Daily heparin level and CBC -Monitor for signs/symptoms of bleeding -Follow up transition to oral anticoagulation as indicated  Suzzanne Cloud, PharmD, BCPS Clinical Pharmacist 12/28/2022 7:56 AM  Received consult for apixaban  Plan: Discontinue heparin and associated labs at time when apixaban is administered Apixaban 10 mg po BID x 5 days followed by 5 mg po BID No dose adjustment anticipated so Pharmacy will sign off on consult but continue to  monitor CBC as ordered by provider and signs/symptoms of bleeding  Thank you for allowing pharmacy to be a part of this patient's care.  Royetta Asal, PharmD, BCPS Clinical Pharmacist [place] Please utilize Amion for appropriate phone number to reach the unit pharmacist ([place]) 12/28/2022 9:36 AM

## 2022-12-28 NOTE — Progress Notes (Signed)
Discharged to home, AVS reviewed and all questions answered. IV removed, tele box returned.

## 2022-12-28 NOTE — Discharge Summary (Signed)
Physician Discharge Summary   Patient: Bianca Gibson MRN: [id number] DOB: [birth year]  Admit date:     12/26/2022  Discharge date: 12/28/22  Discharge Physician: Oswald Hillock   PCP: Benjamine Sprague, MD   Recommendations at discharge:   Follow-up PCP in 2 weeks Follow-up orthopedic surgery as outpatient  Discharge Diagnoses: Principal Problem:   Acute pulmonary embolism ([place]) Active Problems:   Atypical chest pain   Hypophosphatemia   Hypokalemia   GAD (generalized anxiety disorder)   Pulmonary embolism (HCC)  Resolved Problems:   * No resolved hospital problems. *  Hospital Course: 52 year old female with  PMH of anxiety, recent left knee arthroplasty in 08/2022, hemarthrosis recent COVID 12/17/18/2024, present to the hospital with hemoptysis, chest pain and shortness of breath as well as fatigue.  Found to have bilateral PE with DVT.  He was started on IV heparin  Assessment and Plan:  Acute bilateral PE -Left lower extremity DVT -Patient recently had knee surgery in September, she required 2 arthrocentesis after the surgery for hemarthrosis.  Last aspiration was in October. -She also recently had COVID on 12/17/2022; presented with hemoptysis, chest pain and shortness of breath -CT PE protocol was positive for bilateral pulmonary embolism, left more than right -VTE was positive in left peroneal vein; patient takes estrogen on regular basis -This was a provoked event, patient was started on IV heparin -IV heparin has been changed to p.o. Eliquis -She will takes Eliquis 10 mg twice a day for 5 days and then changed to 5 mg twice daily -Total duration of Eliquis will be for at least 3 months  Left knee surgery -She has no knee swelling,  -Discussed with Dr. Berenice Primas, who says okay to start Eliquis -Patient to follow Dr. Shirlee Limerick as outpatient  Chronic pain -Continue Lyrica, oxycodone  Anxiety -Continue BuSpar  Hypertension -Continue metoprolol  Hypokalemia -Potassium  3.4 this morning, will replace potassium before discharge today       Consultants:  Procedures performed:  Disposition: Home Diet recommendation:  Discharge Diet Orders (From admission, onward)     Start     Ordered   12/28/22 0000  Diet - low sodium heart healthy        12/28/22 1220           Regular diet DISCHARGE MEDICATION: Allergies as of 12/28/2022       Reactions   Tramadol Anaphylaxis, Rash   Codeine Nausea And Vomiting   Chlorhexidine Rash        Medication List     STOP taking these medications    aspirin EC 325 MG tablet   estradiol 0.05 MG/24HR patch Commonly known as: VIVELLE-DOT   furosemide 20 MG tablet Commonly known as: LASIX   oxyCODONE-acetaminophen 5-325 MG tablet Commonly known as: PERCOCET/ROXICET       TAKE these medications    acetaminophen 500 MG tablet Commonly known as: TYLENOL Take 1,000 mg by mouth as needed for moderate pain.   apixaban 5 MG Tabs tablet Commonly known as: ELIQUIS Take 2 tablets (10 mg total) by mouth 2 (two) times daily for 5 days.   apixaban 5 MG Tabs tablet Commonly known as: ELIQUIS Take 1 tablet (5 mg total) by mouth 2 (two) times daily. Start taking on: January 02, 2023   busPIRone 10 MG tablet Commonly known as: BUSPAR Take 10 mg by mouth daily as needed for anxiety.   docusate sodium 100 MG capsule Commonly known as: Colace Take 1 capsule (100 mg total)  by mouth 2 (two) times daily.   metoprolol tartrate 25 MG tablet Commonly known as: LOPRESSOR Take 25 mg by mouth 2 (two) times daily.   oxyCODONE 5 MG immediate release tablet Commonly known as: Roxicodone Take 1 tablet (5 mg total) by mouth every 8 (eight) hours as needed.   pregabalin 150 MG capsule Commonly known as: LYRICA Take 150-300 mg by mouth See admin instructions. Take 150 mg by mouth in the morning and 300 mg by mouth at night.   tiZANidine 2 MG tablet Commonly known as: ZANAFLEX Take 1 tablet (2 mg total) by  mouth every 6 (six) hours as needed. What changed:  when to take this reasons to take this        Follow-up Information     Benjamine Sprague, MD Follow up in 2 week(s).   Specialty: Internal Medicine Contact information: [place] [address] [phone]         Dorna Leitz, MD. Schedule an appointment as soon as possible for a visit.   Specialty: Orthopedic Surgery Contact information: [place] [address] [phone]                Discharge Exam: Danley Danker Weights   12/26/22 0542  Weight: 78.9 kg   General-appears in no acute distress Heart-S1-S2, regular, no murmur auscultated Lungs-clear to auscultation bilaterally, no wheezing or crackles auscultated Abdomen-soft, nontender, no organomegaly Extremities-no edema in the lower extremities Neuro-alert, oriented x3, no focal deficit noted  Condition at discharge: good  The results of significant diagnostics from this hospitalization (including imaging, microbiology, ancillary and laboratory) are listed below for reference.   Imaging Studies: ECHOCARDIOGRAM COMPLETE  Result Date: 12/27/2022    ECHOCARDIOGRAM REPORT   Patient Name:   Bianca Gibson Date of Exam: 12/27/2022 Medical Rec #:  [id number]      Height:       63.0 in Accession #:    [id number]     Weight:       174.0 lb Date of Birth:  1971/07/22      BSA:          1.823 m Patient Age:    74 years       BP:           119/79 mmHg Patient Gender: F              HR:           77 bpm. Exam Location:  Inpatient Procedure: 2D Echo, Color Doppler and Cardiac Doppler Indications:    Pulmonary Embolus I26.09  History:        Patient has no prior history of Echocardiogram examinations.                 Recent Covid 19.  Sonographer:    Darlina Sicilian RDCS Referring Phys: [id number] [place]  Sonographer Comments: Image acquisition challenging due to breast implants. IMPRESSIONS  1. Left ventricular ejection fraction, by  estimation, is 60 to 65%. The left ventricle has normal function. The left ventricle has no regional wall motion abnormalities. Left ventricular diastolic parameters were normal.  2. Right ventricular systolic function is normal. The right ventricular size is normal.  3. The mitral valve is grossly normal. Trivial mitral valve regurgitation. No evidence of mitral stenosis.  4. The aortic valve is tricuspid. Aortic valve regurgitation is not visualized. No aortic stenosis is present.  5. The inferior vena cava is normal in size with  greater than 50% respiratory variability, suggesting right atrial pressure of 3 mmHg. Comparison(s): No prior Echocardiogram. FINDINGS  Left Ventricle: Left ventricular ejection fraction, by estimation, is 60 to 65%. The left ventricle has normal function. The left ventricle has no regional wall motion abnormalities. The left ventricular internal cavity size was normal in size. There is  no left ventricular hypertrophy. Left ventricular diastolic parameters were normal. Right Ventricle: The right ventricular size is normal. No increase in right ventricular wall thickness. Right ventricular systolic function is normal. Left Atrium: Left atrial size was normal in size. Right Atrium: Right atrial size was normal in size. Pericardium: There is no evidence of pericardial effusion. Mitral Valve: The mitral valve is grossly normal. Trivial mitral valve regurgitation. No evidence of mitral valve stenosis. Tricuspid Valve: The tricuspid valve is grossly normal. Tricuspid valve regurgitation is not demonstrated. No evidence of tricuspid stenosis. Aortic Valve: The aortic valve is tricuspid. Aortic valve regurgitation is not visualized. No aortic stenosis is present. Pulmonic Valve: The pulmonic valve was grossly normal. Pulmonic valve regurgitation is not visualized. No evidence of pulmonic stenosis. Aorta: The aortic root is normal in size and structure. Venous: The inferior vena cava is normal in  size with greater than 50% respiratory variability, suggesting right atrial pressure of 3 mmHg. IAS/Shunts: The atrial septum is grossly normal.  LEFT VENTRICLE PLAX 2D LVIDd:         3.70 cm   Diastology LVIDs:         2.50 cm   LV e' medial:    8.49 cm/s LV PW:         0.80 cm   LV E/e' medial:  11.2 LV IVS:        1.10 cm   LV e' lateral:   11.70 cm/s LVOT diam:     1.90 cm   LV E/e' lateral: 8.1 LV SV:         38 LV SV Index:   21 LVOT Area:     2.84 cm  RIGHT VENTRICLE RV S prime:     9.79 cm/s TAPSE (M-mode): 1.8 cm LEFT ATRIUM             Index        RIGHT ATRIUM          Index LA diam:        4.00 cm 2.19 cm/m   RA Area:     9.36 cm LA Vol (A2C):   30.9 ml 16.95 ml/m  RA Volume:   17.90 ml 9.82 ml/m LA Vol (A4C):   38.1 ml 20.91 ml/m LA Biplane Vol: 37.0 ml 20.30 ml/m  AORTIC VALVE LVOT Vmax:   71.10 cm/s LVOT Vmean:  39.800 cm/s LVOT VTI:    0.135 m  AORTA Ao Root diam: 2.70 cm MITRAL VALVE MV Area (PHT): 4.60 cm    SHUNTS MV Decel Time: 165 msec    Systemic VTI:  0.14 m MV E velocity: 95.10 cm/s  Systemic Diam: 1.90 cm MV A velocity: 43.70 cm/s MV E/A ratio:  2.18 Gwyndolyn Kaufman MD Electronically signed by Gwyndolyn Kaufman MD Signature Date/Time: 12/27/2022/12:29:25 PM    Final    VAS [place] LOWER EXTREMITY VENOUS (DVT)  Result Date: 12/27/2022  Lower Venous DVT Study Patient Name:  SINDI BECKWORTH  Date of Exam:   12/27/2022 Medical Rec #: [id number]       Accession #:    [id number] Date of Birth: 11-27-1971  Patient Gender: F Patient Age:   40 years Exam Location[facility]  [facility] Procedure:      VAS [place] LOWER EXTREMITY VENOUS (DVT) Referring Phys: PRANAV PATEL --------------------------------------------------------------------------------  Indications: Pulmonary embolism.  Risk Factors: Confirmed PE. Anticoagulation: Heparin. Limitations: Poor ultrasound/tissue interface. Comparison Study: No prior studies. Performing Technologist: Oliver Hum RVT  Examination Guidelines: A  complete evaluation includes B-mode imaging, spectral Doppler, color Doppler, and power Doppler as needed of all accessible portions of each vessel. Bilateral testing is considered an integral part of a complete examination. Limited examinations for reoccurring indications may be performed as noted. The reflux portion of the exam is performed with the patient in reverse Trendelenburg.  +---------+---------------+---------+-----------+----------+--------------+ RIGHT    CompressibilityPhasicitySpontaneityPropertiesThrombus Aging +---------+---------------+---------+-----------+----------+--------------+ CFV      Full           Yes      Yes                                 +---------+---------------+---------+-----------+----------+--------------+ SFJ      Full                                                        +---------+---------------+---------+-----------+----------+--------------+ FV Prox  Full                                                        +---------+---------------+---------+-----------+----------+--------------+ FV Mid   Full                                                        +---------+---------------+---------+-----------+----------+--------------+ FV DistalFull                                                        +---------+---------------+---------+-----------+----------+--------------+ PFV      Full                                                        +---------+---------------+---------+-----------+----------+--------------+ POP      Full           Yes      Yes                                 +---------+---------------+---------+-----------+----------+--------------+ PTV      Full                                                        +---------+---------------+---------+-----------+----------+--------------+  PERO     Full                                                         +---------+---------------+---------+-----------+----------+--------------+   +---------+---------------+---------+-----------+----------+-----------------+ LEFT     CompressibilityPhasicitySpontaneityPropertiesThrombus Aging    +---------+---------------+---------+-----------+----------+-----------------+ CFV      Full           Yes      Yes                                    +---------+---------------+---------+-----------+----------+-----------------+ SFJ      Full                                                           +---------+---------------+---------+-----------+----------+-----------------+ FV Prox  Full                                                           +---------+---------------+---------+-----------+----------+-----------------+ FV Mid   Full                                                           +---------+---------------+---------+-----------+----------+-----------------+ FV DistalFull                                                           +---------+---------------+---------+-----------+----------+-----------------+ PFV      Full                                                           +---------+---------------+---------+-----------+----------+-----------------+ POP      Full           Yes      Yes                                    +---------+---------------+---------+-----------+----------+-----------------+ PTV      Full                                                           +---------+---------------+---------+-----------+----------+-----------------+ PERO     Partial  Age Indeterminate +---------+---------------+---------+-----------+----------+-----------------+     Summary: RIGHT: - There is no evidence of deep vein thrombosis in the lower extremity.  - No cystic structure found in the popliteal fossa.  LEFT: - Findings consistent with age indeterminate deep vein  thrombosis involving the left peroneal veins. - No cystic structure found in the popliteal fossa.  *See table(s) above for measurements and observations. Electronically signed by Deitra Mayo MD on 12/27/2022 at 10:15:59 AM.    Final    CT Angio Chest PE W and/or Wo Contrast  Addendum Date: 12/26/2022   ADDENDUM REPORT: 12/26/2022 07:12 ADDENDUM: Critical Value/emergent results were called by telephone at the time of interpretation on 12/26/2022 at 0707 hours to Dr. Veatrice Kells , who verbally acknowledged these results. She advises the patient is also COVID-19 positive. Electronically Signed   By: Genevie Ann M.D.   On: 12/26/2022 07:12   Result Date: 12/26/2022 CLINICAL DATA:  52 year old female with shortness of breath, cough, left chest pain. EXAM: CT ANGIOGRAPHY CHEST WITH CONTRAST TECHNIQUE: Multidetector CT imaging of the chest was performed using the standard protocol during bolus administration of intravenous contrast. Multiplanar CT image reconstructions and MIPs were obtained to evaluate the vascular anatomy. RADIATION DOSE REDUCTION: This exam was performed according to the departmental dose-optimization program which includes automated exposure control, adjustment of the mA and/or kV according to patient size and/or use of iterative reconstruction technique. CONTRAST:  57m OMNIPAQUE IOHEXOL 350 MG/ML SOLN COMPARISON:  Chest CTA 01/03/2015. FINDINGS: Cardiovascular: Adequate contrast bolus timing in the pulmonary arterial tree. Left lower lobar pulmonary embolus with filling defect at the branching of the left lower lobe segmental arteries on series 6, image 142. Clot continues distally especially in the medial basal and lateral basal segments with associated downstream airspace disease, see below. No saddle embolus. Small volume contralateral right lower lobe thrombus which appears mostly nonocclusive (series 6, image 123). RV LV ratio 0.77, within normal limits. No cardiomegaly or pericardial  effusion. Negative visible aorta. No calcified coronary artery plaque is evident. Mediastinum/Nodes: No mediastinal mass or lymphadenopathy. Lungs/Pleura: Platelike and wedge-shaped bilateral lower lobe lung opacity, although mostly enhancing. However, there is less enhancement of lateral basal segment left lower lobe confluent opacity which is suspicious for pulmonary infarct (series 4, image 48) and superimposed small layering left pleural effusion with simple fluid density. Major airways remain patent. Superimposed bilateral linear scarring and atelectasis in both middle lobes and along the right minor fissure. No right pleural effusion. Upper Abdomen: Negative visible liver, spleen, pancreas, adrenal glands, bowel and left kidney. Musculoskeletal: No acute or suspicious osseous lesion identified. Incidental bilateral breast implants. Review of the MIP images confirms the above findings. IMPRESSION: 1. Positive for acute left > right lower lobe pulmonary emboli. No saddle embolus or CTA evidence of right heart strain. 2. Probable left lower lobe lateral basal segment pulmonary infarct with small layering left pleural effusion. Superimposed additional bilateral lower lobe and lung base atelectasis, scarring. Electronically Signed: By: HGenevie AnnM.D. On: 12/26/2022 07:06    Microbiology: Results for orders placed or performed during the hospital encounter of 08/16/22  Surgical pcr screen     Status: None   Collection Time: 08/16/22 12:53 PM   Specimen: Nasal Mucosa; Nasal Swab  Result Value Ref Range Status   MRSA, PCR NEGATIVE NEGATIVE Final   Staphylococcus aureus NEGATIVE NEGATIVE Final    Comment: (NOTE) The Xpert SA Assay (FDA approved for NASAL specimens in patients 255years of age and older),  is one component of a comprehensive surveillance program. It is not intended to diagnose infection nor to guide or monitor treatment. Performed at [facility], [place] [address] [place], [place] [address]     Labs: CBC: Recent Labs  Lab 12/26/22 [phone] 12/27/22 0525 12/28/22 0557  WBC 5.8 3.9* 4.3  NEUTROABS 4.1  --   --   HGB 14.5 12.0 12.7  HCT 44.9 39.6 40.8  MCV 90.7 94.7 93.2  PLT 319 295 615   Basic Metabolic Panel: Recent Labs  Lab 12/26/22 0625 12/27/22 0525 12/28/22 0557  NA 139 142 141  K 3.4* 3.3* 3.4*  CL 102 108 109  CO2 '26 24 22  '$ GLUCOSE 113* 92 96  BUN '9 8 12  '$ CREATININE 0.73 0.68 0.72  CALCIUM 9.8 8.8* 9.1  MG 2.0 1.9  --   PHOS 2.0* 4.4  --    Liver Function Tests: Recent Labs  Lab 12/27/22 0525  AST 25  ALT 20  ALKPHOS 69  BILITOT 0.6  PROT 5.8*  ALBUMIN 3.0*   CBG: No results for input(s): "GLUCAP" in the last 168 hours.  Discharge time spent: greater than 30 minutes.  Signed: Oswald Hillock, MD [facility] 12/28/2022

## 2023-09-03 ENCOUNTER — Encounter (HOSPITAL_BASED_OUTPATIENT_CLINIC_OR_DEPARTMENT_OTHER): Payer: Self-pay | Admitting: Emergency Medicine

## 2023-09-03 ENCOUNTER — Other Ambulatory Visit: Payer: Self-pay

## 2023-09-03 ENCOUNTER — Emergency Department (HOSPITAL_BASED_OUTPATIENT_CLINIC_OR_DEPARTMENT_OTHER)
Admission: EM | Admit: 2023-09-03 | Discharge: 2023-09-03 | Disposition: A | Discharge status: Home / Self Care | Payer: 59 | Attending: Emergency Medicine | Admitting: Emergency Medicine

## 2023-09-03 ENCOUNTER — Emergency Department (HOSPITAL_BASED_OUTPATIENT_CLINIC_OR_DEPARTMENT_OTHER): Payer: 59

## 2023-09-03 DIAGNOSIS — S20212A Contusion of left front wall of thorax, initial encounter: Secondary | ICD-10-CM | POA: Diagnosis not present

## 2023-09-03 DIAGNOSIS — S4992XA Unspecified injury of left shoulder and upper arm, initial encounter: Secondary | ICD-10-CM | POA: Diagnosis present

## 2023-09-03 DIAGNOSIS — S46912A Strain of unspecified muscle, fascia and tendon at shoulder and upper arm level, left arm, initial encounter: Secondary | ICD-10-CM | POA: Insufficient documentation

## 2023-09-03 DIAGNOSIS — W19XXXA Unspecified fall, initial encounter: Secondary | ICD-10-CM | POA: Diagnosis not present

## 2023-09-03 MED ORDER — HYDROCODONE-ACETAMINOPHEN 5-325 MG PO TABS
1.0000 | ORAL_TABLET | Freq: Once | ORAL | Status: AC
  Administered 2023-09-03: 1 via ORAL
  Filled 2023-09-03: qty 1

## 2023-09-03 MED ORDER — HYDROCODONE-ACETAMINOPHEN 5-325 MG PO TABS
1.0000 | ORAL_TABLET | Freq: Four times a day (QID) | ORAL | 0 refills | Status: AC | PRN
Start: 2023-09-03 — End: ?

## 2023-09-03 NOTE — Discharge Instructions (Addendum)
Take Norco as directed for pain. Return to the ED with any worsening symptoms such as more severe pain, shortness of breath or new concern.

## 2023-09-03 NOTE — ED Provider Notes (Signed)
[place] [facility] AT [facility] Provider Note   CSN: [id number] Arrival date & time: 09/03/23  1454     History  Chief Complaint  Patient presents with   Bianca Gibson is a 52 y.o. female.  Patient to ED after fall last night landing on her left shoulder and left chest. She denies head injury, no neck pain. No abdominal pain, nausea or vomiting. She initially had shoulder pain that is somewhat improved today, but progressively worsening pain in the left lower chest that is worse with deep breathing. 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   The history is provided by the patient. No language interpreter was used.  Fall       Home Medications Prior to Admission medications   Medication Sig Start Date End Date Taking? Authorizing Provider  HYDROcodone-acetaminophen (NORCO/VICODIN) 5-325 MG tablet Take 1-2 tablets by mouth every 6 (six) hours as needed. 09/03/23  Yes Elpidio Anis, PA-C  acetaminophen (TYLENOL) 500 MG tablet Take 1,000 mg by mouth as needed for moderate pain.    [provider]  apixaban (ELIQUIS) 5 MG TABS tablet Take 2 tablets (10 mg total) by mouth 2 (two) times daily for 5 days. 12/28/22 01/02/23  Meredeth Ide, MD  apixaban (ELIQUIS) 5 MG TABS tablet Take 1 tablet (5 mg total) by mouth 2 (two) times daily. 01/02/23   Meredeth Ide, MD  busPIRone (BUSPAR) 10 MG tablet Take 10 mg by mouth daily as needed for anxiety. 03/18/22   [provider]  metoprolol tartrate (LOPRESSOR) 25 MG tablet Take 25 mg by mouth 2 (two) times daily.    [provider]  oxyCODONE (ROXICODONE) 5 MG immediate release tablet Take 1 tablet (5 mg total) by mouth every 8 (eight) hours as needed. 12/28/22   Meredeth Ide, MD  pregabalin (LYRICA) 150 MG capsule Take 150-300 mg by mouth See admin instructions. Take 150 mg by mouth in the morning and 300 mg by mouth  at night. 07/14/22   [provider]  tiZANidine (ZANAFLEX) 2 MG tablet Take 1 tablet (2 mg total) by mouth every 6 (six) hours as needed. Patient taking differently: Take 2 mg by mouth 3 (three) times daily as needed for muscle spasms. 08/29/22   Allena Katz, PA-C      Allergies    Tramadol, Codeine, and Chlorhexidine    Review of Systems   Review of Systems  Physical Exam Updated Vital Signs BP (!) 140/84   Pulse (!) 116   Temp 97.7 F (36.5 C)   Resp 16   Ht 5\' 2"  (1.575 m)   Wt 79.4 kg   SpO2 99%   BMI 32.01 kg/m  Physical Exam Vitals and nursing note reviewed.  Constitutional:      Appearance: Normal appearance.  HENT:     Head: Normocephalic and atraumatic.  Neck:     Comments: No midline cervical tenderness. Cardiovascular:     Rate and Rhythm: Regular rhythm. Tachycardia present.     Heart sounds: No murmur heard. Pulmonary:     Breath sounds: Normal breath sounds. No wheezing.     Comments: Breath sounds to all fields.  Chest:     Chest wall: Tenderness present.       Comments: Tender to left lower chest. No bruising of chest wall, no deformity.  Abdominal:  Palpations: Abdomen is soft.     Comments: No bruising of abdominal wall. Nontender LUQ abdomen.  Musculoskeletal:     Cervical back: Normal range of motion and neck supple.     Comments: No left clavicular tenderness. No bony deformity of left shoulder. No specific tenderness. There is pain with full forward extension and abduction of the arm. No bruising or discoloration.   Skin:    General: Skin is warm and dry.     Findings: No bruising.  Neurological:     Mental Status: She is alert and oriented to person, place, and time.     Sensory: No sensory deficit.     ED Results / Procedures / Treatments   Labs (all labs ordered are listed, but only abnormal results are displayed) Labs Reviewed - No data to display  EKG None  Radiology DG Shoulder Left  Result Date:  09/03/2023 CLINICAL DATA:  fall EXAM: LEFT SHOULDER - 2+ VIEW COMPARISON:  None Available. FINDINGS: No acute fracture or dislocation. Joint spaces and alignment are maintained. No area of erosion or osseous destruction. No unexpected radiopaque foreign body. Soft tissues are unremarkable. IMPRESSION: 1. No acute fracture or dislocation. Electronically Signed   By: Meda Klinefelter M.D.   On: 09/03/2023 16:11   DG Chest 2 View  Result Date: 09/03/2023 CLINICAL DATA:  fall, rib pain; 190176 Fall 190176 EXAM: CHEST - 2 VIEW; LEFT RIBS - 2 VIEW COMPARISON:  December 26, 2022., August 16, 2022 FINDINGS: The cardiomediastinal silhouette is unchanged in contour. No pleural effusion. No pneumothorax. No acute pleuroparenchymal abnormality. Visualized abdomen is unremarkable. Mild degenerative changes of the thoracic spine. Revisualization of a compression fracture deformity of a superior lumbar vertebral body status post vertebroplasty. No acute displaced rib fracture visualized. Breast implant. IMPRESSION: No acute displaced rib fracture visualized. No acute cardiopulmonary abnormality. Electronically Signed   By: Meda Klinefelter M.D.   On: 09/03/2023 16:11   DG Ribs Unilateral Left  Result Date: 09/03/2023 CLINICAL DATA:  fall, rib pain; 190176 Fall 190176 EXAM: CHEST - 2 VIEW; LEFT RIBS - 2 VIEW COMPARISON:  December 26, 2022., August 16, 2022 FINDINGS: The cardiomediastinal silhouette is unchanged in contour. No pleural effusion. No pneumothorax. No acute pleuroparenchymal abnormality. Visualized abdomen is unremarkable. Mild degenerative changes of the thoracic spine. Revisualization of a compression fracture deformity of a superior lumbar vertebral body status post vertebroplasty. No acute displaced rib fracture visualized. Breast implant. IMPRESSION: No acute displaced rib fracture visualized. No acute cardiopulmonary abnormality. Electronically Signed   By: Meda Klinefelter M.D.   On: 09/03/2023  16:11    Procedures Procedures    Medications Ordered in ED Medications  HYDROcodone-acetaminophen (NORCO/VICODIN) 5-325 MG per tablet 1 tablet (1 tablet Oral Given 09/03/23 1542)    ED Course/ Medical Decision Making/ A&P Clinical Course as of 09/03/23 1633  Sun Sep 03, 2023  1533 Stable (PA SU) Here with ccx fall. Hx of PE/DVT Larey Seat on to left side. XR to rule out HTX/PTX  [CC]    Clinical Course User Index [CC] Glyn Ade, MD                                 Medical Decision Making This patient presents to the ED for concern of left chest injury, ?rib fracture, this involves an extensive number of treatment options, and is a complaint that carries with it a high risk of complications and morbidity.  The differential diagnosis includes pneumothorax, hemothorax, fractured rib, upper abdominal injury   Co morbidities that complicate the patient evaluation  Osteoporosis history   External records from outside source obtained and reviewed including Care Everywhere, [facility]     Imaging Studies ordered:  I ordered imaging studies including 2 view chest, left rib series, left shoulder  I independently visualized and interpreted imaging which showed no visualized rib fracture, pneumothorax I agree with the radiologist interpretation      Problem List / ED Course / Critical interventions / Medication management  Chest and shoulder injury after fall Negative imaging for fracture or pneumothorax No hypoxia.  Initially tachycardic, improved with pain control I ordered medication including Norco  for pain  Reevaluation of the patient after these medicines showed that the patient improved I have reviewed the patients home medicines and have made adjustments as needed   Social Determinants of Health:  Well established with health care providers, previous ER nurse felt knowledgeable and reliable to return with any change in symptoms.       Amount and/or  Complexity of Data Reviewed Radiology: ordered.  Risk Prescription drug management.           Final Clinical Impression(s) / ED Diagnoses Final diagnoses:  Shoulder strain, left, initial encounter  Contusion of left chest wall, initial encounter    Rx / DC Orders ED Discharge Orders          Ordered    HYDROcodone-acetaminophen (NORCO/VICODIN) 5-325 MG tablet  Every 6 hours PRN        09/03/23 1632              Elpidio Anis, PA-C 09/03/23 1633    Glyn Ade, MD 09/03/23 1727

## 2023-09-03 NOTE — ED Triage Notes (Signed)
Pt fell to the side onto grass last night; c/o pain to LT shoulder, ribs and back

## 2024-06-06 ENCOUNTER — Ambulatory Visit
Admission: RE | Admit: 2024-06-06 | Discharge: 2024-06-06 | Disposition: A | Discharge status: Home / Self Care | Source: Ambulatory Visit

## 2024-06-06 VITALS — BP 125/84 | HR 104 | Temp 97.5°F | Resp 17

## 2024-06-06 DIAGNOSIS — S80861A Insect bite (nonvenomous), right lower leg, initial encounter: Secondary | ICD-10-CM | POA: Diagnosis not present

## 2024-06-06 DIAGNOSIS — W57XXXA Bitten or stung by nonvenomous insect and other nonvenomous arthropods, initial encounter: Secondary | ICD-10-CM

## 2024-06-06 MED ORDER — DOXYCYCLINE HYCLATE 100 MG PO CAPS
100.0000 mg | ORAL_CAPSULE | Freq: Two times a day (BID) | ORAL | 0 refills | Status: AC
Start: 2024-06-06 — End: ?

## 2024-06-06 NOTE — Discharge Instructions (Addendum)
  1. Insect bites and stings, initial encounter (Primary) - doxycycline (VIBRAMYCIN) 100 MG capsule; Take 1 capsule (100 mg total) by mouth 2 (two) times daily.  Dispense: 20 capsule; Refill: 0 What does an infected insect bite look like?  Redness that spreads beyond the bite  Swelling that gets worse over time  Pain or tenderness at the site  Warmth around the bite  Pus or yellow fluid coming from the bite  Fever or feeling unwell How to care for an infected insect bite: 1. Clean the area: Wash the bite gently with soap and water . Keep it clean and dry.[5-6] 2. Reduce swelling and pain: Apply a cold compress for 10-15 minutes at a time. Over-the-counter pain relievers (like acetaminophen  or ibuprofen ) and oral antihistamines can help with pain and itching.[4] 3. Avoid scratching: Scratching can make the infection worse and increase the risk of scarring. 4. Watch for signs of spreading infection: If redness, swelling, or pain spreads, or if you develop a fever, seek medical attention.[2-3] When are antibiotics needed? Most insect bites do not need antibiotics. The American Academy of Allergy, Asthma, and Immunology states that antibiotics are only needed if there are clear signs of infection, such as pus, spreading redness, or fever.[4] If prescribed, the Infectious Diseases Society of America recommends antibiotics that cover common skin bacteria, such as amoxicillin-clavulanate.[1] Do not use leftover antibiotics or start antibiotics without a doctor's advice. How to prevent insect bites:  Wear long sleeves and pants outdoors.  Use insect repellent with DEET or picaridin on exposed skin.[7-8]  Avoid areas with lots of insects, especially at dawn and dusk. When to seek medical help:  If the bite becomes very red, swollen, or painful  If you see pus or the redness is spreading  If you have a fever, chills, or feel very unwell  If you have trouble breathing, swelling of the face or mouth, or  feel faint (these are signs of a severe allergic reaction and require emergency care) Most infected insect bites get better with proper care. If you are worried or your symptoms are getting worse, contact your healthcare provider or follow up in ER.

## 2024-06-06 NOTE — ED Provider Notes (Signed)
 [facility]  Note:  This document was prepared using Dragon voice recognition software and may include unintentional dictation errors.  MRN: [id number] DOB: 1971-06-10  Subjective:   Bianca Gibson is a 53 y.o. female presenting for evaluation of multiple erythematous, slightly painful lesions to the right leg, possibly insect bites.  Patient reports that Saturday she was at a wedding and believes she was bit by several mosquitoes.  Now patient is complaining increased redness, warmth, pain and surrounding each individual bite.  Patient reports that they are slightly pruritic but she knows not to scratch them.  Patient was concern for possible secondary infection.  Patient denies any tick bites or removing any attached ticks.  Patient reports past history of cellulitis secondary to insect bites needing antibiotic therapy for treatment.  Patient has been using topical Benadryl cream and Neosporin with lidocaine for treatment.  No current facility-administered medications for this encounter.  Current Outpatient Medications:    doxycycline (VIBRAMYCIN) 100 MG capsule, Take 1 capsule (100 mg total) by mouth 2 (two) times daily., Disp: 20 capsule, Rfl: 0   acetaminophen  (TYLENOL ) 500 MG tablet, Take 1,000 mg by mouth as needed for moderate pain., Disp: , Rfl:    apixaban  (ELIQUIS ) 5 MG TABS tablet, Take 2 tablets (10 mg total) by mouth 2 (two) times daily for 5 days. (Patient not taking: Reported on 06/06/2024), Disp: 20 tablet, Rfl: 0   apixaban  (ELIQUIS ) 5 MG TABS tablet, Take 1 tablet (5 mg total) by mouth 2 (two) times daily. (Patient not taking: Reported on 06/06/2024), Disp: 60 tablet, Rfl: 2   busPIRone  (BUSPAR ) 10 MG tablet, Take 10 mg by mouth daily as needed for anxiety., Disp: , Rfl:    HYDROcodone -acetaminophen  (NORCO/VICODIN) 5-325 MG tablet, Take 1-2 tablets by mouth every 6 (six) hours as needed., Disp: 20 tablet, Rfl: 0   metoprolol  tartrate (LOPRESSOR ) 25 MG  tablet, Take 25 mg by mouth 2 (two) times daily., Disp: , Rfl:    oxyCODONE  (ROXICODONE ) 5 MG immediate release tablet, Take 1 tablet (5 mg total) by mouth every 8 (eight) hours as needed. (Patient not taking: Reported on 06/06/2024), Disp: 20 tablet, Rfl: 0   pregabalin  (LYRICA ) 150 MG capsule, Take 150-300 mg by mouth See admin instructions. Take 150 mg by mouth in the morning and 300 mg by mouth at night., Disp: , Rfl:    tiZANidine  (ZANAFLEX ) 2 MG tablet, Take 1 tablet (2 mg total) by mouth every 6 (six) hours as needed. (Patient taking differently: Take 2 mg by mouth 3 (three) times daily as needed for muscle spasms.), Disp: 60 tablet, Rfl: 0   Allergies  Allergen Reactions   Tramadol Anaphylaxis and Rash   Codeine Nausea And Vomiting   Chlorhexidine  Rash    Past Medical History:  Diagnosis Date   Anxiety    Arthritis    GERD (gastroesophageal reflux disease)    History of kidney stones    Obesity    Panic attack    Pneumonia    Tachycardia    Trigeminal neuralgia      Past Surgical History:  Procedure Laterality Date   ABDOMINAL HYSTERECTOMY     BACK SURGERY     CHOLECYSTECTOMY     EXPLORATORY LAPAROTOMY     HERNIA REPAIR     KIDNEY STONE SURGERY     KNEE ARTHROSCOPY Left    TOTAL KNEE ARTHROPLASTY Left 08/29/2022   Procedure: TOTAL KNEE ARTHROPLASTY;  Surgeon: Yvone Rush, MD;  Location: [facility];  Service: Orthopedics;  Laterality: Left;    No family history on file.  Social History   Tobacco Use   Smoking status: Never   Smokeless tobacco: Never  Vaping Use   Vaping status: Never Used  Substance Use Topics   Alcohol use: Yes    Comment: socially   Drug use: Not Currently    Comment: CBD, Delta 8    ROS Refer to HPI for ROS details.  Objective:    Vitals: BP 125/84 (BP Location: Right Arm)   Pulse (!) 104   Temp (!) 97.5 F (36.4 C) (Oral)   Resp 17   SpO2 95%   Physical Exam Vitals and nursing note reviewed.  Constitutional:      General:  She is not in acute distress.    Appearance: Normal appearance. She is well-developed. She is not ill-appearing or toxic-appearing.  HENT:     Head: Normocephalic and atraumatic.   Cardiovascular:     Rate and Rhythm: Normal rate.  Pulmonary:     Effort: Pulmonary effort is normal. No respiratory distress.   Skin:    General: Skin is warm and dry.     Findings: Erythema and wound (Erythematous, raised, lesion with central ulceration, warmth, no drainage, no bleeding, consistent with localized cellulitis.) present.   Neurological:     General: No focal deficit present.     Mental Status: She is alert and oriented to person, place, and time.   Psychiatric:        Mood and Affect: Mood normal.        Behavior: Behavior normal.     Procedures  No results found for this or any previous visit (from the past 24 hours).  Assessment and Plan :     Discharge Instructions       1. Insect bites and stings, initial encounter (Primary) - doxycycline (VIBRAMYCIN) 100 MG capsule; Take 1 capsule (100 mg total) by mouth 2 (two) times daily.  Dispense: 20 capsule; Refill: 0 What does an infected insect bite look like?  Redness that spreads beyond the bite  Swelling that gets worse over time  Pain or tenderness at the site  Warmth around the bite  Pus or yellow fluid coming from the bite  Fever or feeling unwell How to care for an infected insect bite: 1. Clean the area: Wash the bite gently with soap and water . Keep it clean and dry.[5-6] 2. Reduce swelling and pain: Apply a cold compress for 10-15 minutes at a time. Over-the-counter pain relievers (like acetaminophen  or ibuprofen ) and oral antihistamines can help with pain and itching.[4] 3. Avoid scratching: Scratching can make the infection worse and increase the risk of scarring. 4. Watch for signs of spreading infection: If redness, swelling, or pain spreads, or if you develop a fever, seek medical attention.[2-3] When are  antibiotics needed? Most insect bites do not need antibiotics. The American Academy of Allergy, Asthma, and Immunology states that antibiotics are only needed if there are clear signs of infection, such as pus, spreading redness, or fever.[4] If prescribed, the Infectious Diseases Society of America recommends antibiotics that cover common skin bacteria, such as amoxicillin-clavulanate.[1] Do not use leftover antibiotics or start antibiotics without a doctor's advice. How to prevent insect bites:  Wear long sleeves and pants outdoors.  Use insect repellent with DEET or picaridin on exposed skin.[7-8]  Avoid areas with lots of insects, especially at dawn and dusk. When to seek medical help:  If the bite  becomes very red, swollen, or painful  If you see pus or the redness is spreading  If you have a fever, chills, or feel very unwell  If you have trouble breathing, swelling of the face or mouth, or feel faint (these are signs of a severe allergic reaction and require emergency care) Most infected insect bites get better with proper care. If you are worried or your symptoms are getting worse, contact your healthcare provider or follow up in ER.      Becky Berberian B Genny Caulder   Rumaysa Sabatino, [place], [place] 06/06/24 [phone]

## 2024-06-06 NOTE — ED Triage Notes (Addendum)
 Pt c/o insect bites on back of right leg that have gotten bigger, are very red, swollen, and hard. Pt got bites on Saturday at wedding.
